# Patient Record
Sex: Female | Born: 1955 | Race: White | Hispanic: No | Marital: Single | Smoking: Current every day smoker
Health system: Southern US, Community
[De-identification: ages and names within clinical notes are randomized; demographics above are authoritative.]

## PROBLEM LIST (undated history)

## (undated) DIAGNOSIS — M1990 Unspecified osteoarthritis, unspecified site: Secondary | ICD-10-CM

## (undated) DIAGNOSIS — F4310 Post-traumatic stress disorder, unspecified: Secondary | ICD-10-CM

## (undated) DIAGNOSIS — M48061 Spinal stenosis, lumbar region without neurogenic claudication: Secondary | ICD-10-CM

## (undated) DIAGNOSIS — G4730 Sleep apnea, unspecified: Secondary | ICD-10-CM

## (undated) DIAGNOSIS — E7800 Pure hypercholesterolemia, unspecified: Secondary | ICD-10-CM

## (undated) DIAGNOSIS — F410 Panic disorder [episodic paroxysmal anxiety] without agoraphobia: Secondary | ICD-10-CM

## (undated) DIAGNOSIS — J449 Chronic obstructive pulmonary disease, unspecified: Secondary | ICD-10-CM

## (undated) HISTORY — PX: SKIN BIOPSY: SHX1

## (undated) HISTORY — PX: HERNIA REPAIR: SHX51

## (undated) HISTORY — PX: ABDOMINAL HYSTERECTOMY: SHX81

## (undated) HISTORY — PX: CHOLECYSTECTOMY: SHX55

---

## 2011-03-13 DIAGNOSIS — M1990 Unspecified osteoarthritis, unspecified site: Secondary | ICD-10-CM

## 2011-03-13 HISTORY — DX: Unspecified osteoarthritis, unspecified site: M19.90

## 2012-03-12 DIAGNOSIS — F4310 Post-traumatic stress disorder, unspecified: Secondary | ICD-10-CM

## 2012-03-12 DIAGNOSIS — E7800 Pure hypercholesterolemia, unspecified: Secondary | ICD-10-CM

## 2012-03-12 DIAGNOSIS — F410 Panic disorder [episodic paroxysmal anxiety] without agoraphobia: Secondary | ICD-10-CM

## 2012-03-12 DIAGNOSIS — G4730 Sleep apnea, unspecified: Secondary | ICD-10-CM

## 2012-03-12 DIAGNOSIS — M48061 Spinal stenosis, lumbar region without neurogenic claudication: Secondary | ICD-10-CM

## 2012-03-12 HISTORY — DX: Post-traumatic stress disorder, unspecified: F43.10

## 2012-03-12 HISTORY — DX: Panic disorder (episodic paroxysmal anxiety): F41.0

## 2012-03-12 HISTORY — DX: Pure hypercholesterolemia, unspecified: E78.00

## 2012-03-12 HISTORY — PX: OTHER SURGICAL HISTORY: SHX169

## 2012-03-12 HISTORY — DX: Spinal stenosis, lumbar region without neurogenic claudication: M48.061

## 2012-03-12 HISTORY — DX: Sleep apnea, unspecified: G47.30

## 2013-03-12 HISTORY — PX: CARPAL TUNNEL RELEASE: SHX101

## 2018-03-01 ENCOUNTER — Emergency Department (HOSPITAL_COMMUNITY): Payer: Medicare (Managed Care)

## 2018-03-01 ENCOUNTER — Other Ambulatory Visit: Payer: Self-pay

## 2018-03-01 ENCOUNTER — Encounter (HOSPITAL_COMMUNITY): Payer: Self-pay | Admitting: Emergency Medicine

## 2018-03-01 ENCOUNTER — Inpatient Hospital Stay (HOSPITAL_COMMUNITY)
Admission: EM | Admit: 2018-03-01 | Discharge: 2018-03-03 | DRG: 392 | Disposition: A | Discharge status: Home / Self Care | Payer: Medicare (Managed Care) | Attending: Internal Medicine | Admitting: Internal Medicine

## 2018-03-01 DIAGNOSIS — K219 Gastro-esophageal reflux disease without esophagitis: Secondary | ICD-10-CM | POA: Diagnosis present

## 2018-03-01 DIAGNOSIS — Z720 Tobacco use: Secondary | ICD-10-CM

## 2018-03-01 DIAGNOSIS — A09 Infectious gastroenteritis and colitis, unspecified: Principal | ICD-10-CM | POA: Diagnosis present

## 2018-03-01 DIAGNOSIS — J449 Chronic obstructive pulmonary disease, unspecified: Secondary | ICD-10-CM | POA: Diagnosis present

## 2018-03-01 DIAGNOSIS — Z888 Allergy status to other drugs, medicaments and biological substances status: Secondary | ICD-10-CM

## 2018-03-01 DIAGNOSIS — M4800 Spinal stenosis, site unspecified: Secondary | ICD-10-CM | POA: Diagnosis present

## 2018-03-01 DIAGNOSIS — Z91040 Latex allergy status: Secondary | ICD-10-CM

## 2018-03-01 DIAGNOSIS — E860 Dehydration: Secondary | ICD-10-CM | POA: Diagnosis present

## 2018-03-01 DIAGNOSIS — D649 Anemia, unspecified: Secondary | ICD-10-CM | POA: Diagnosis present

## 2018-03-01 DIAGNOSIS — F419 Anxiety disorder, unspecified: Secondary | ICD-10-CM | POA: Diagnosis present

## 2018-03-01 DIAGNOSIS — K529 Noninfective gastroenteritis and colitis, unspecified: Secondary | ICD-10-CM | POA: Diagnosis present

## 2018-03-01 DIAGNOSIS — Z79899 Other long term (current) drug therapy: Secondary | ICD-10-CM

## 2018-03-01 DIAGNOSIS — F4310 Post-traumatic stress disorder, unspecified: Secondary | ICD-10-CM | POA: Diagnosis present

## 2018-03-01 HISTORY — DX: Chronic obstructive pulmonary disease, unspecified: J44.9

## 2018-03-01 HISTORY — DX: Pure hypercholesterolemia, unspecified: E78.00

## 2018-03-01 HISTORY — DX: Spinal stenosis, lumbar region without neurogenic claudication: M48.061

## 2018-03-01 HISTORY — DX: Post-traumatic stress disorder, unspecified: F43.10

## 2018-03-01 HISTORY — DX: Unspecified osteoarthritis, unspecified site: M19.90

## 2018-03-01 HISTORY — DX: Sleep apnea, unspecified: G47.30

## 2018-03-01 HISTORY — DX: Panic disorder (episodic paroxysmal anxiety): F41.0

## 2018-03-01 LAB — I-STAT CG4 LACTIC ACID, ED: Lactic Acid, Venous: 0.8 mmol/L (ref 0.5–1.9)

## 2018-03-01 LAB — COMPREHENSIVE METABOLIC PANEL
ALT: 22 U/L (ref 0–44)
AST: 22 U/L (ref 15–41)
Albumin: 3.9 g/dL (ref 3.5–5.0)
Alkaline Phosphatase: 72 U/L (ref 38–126)
Anion gap: 8 (ref 5–15)
BUN: 16 mg/dL (ref 8–23)
CO2: 24 mmol/L (ref 22–32)
Calcium: 9.3 mg/dL (ref 8.9–10.3)
Chloride: 106 mmol/L (ref 98–111)
Creatinine, Ser: 0.67 mg/dL (ref 0.44–1.00)
GFR calc Af Amer: >60 mL/min (ref >60)
GFR calc non Af Amer: >60 mL/min (ref >60)
Glucose, Bld: 123 mg/dL — ABNORMAL HIGH (ref 70–99)
Potassium: 3.6 mmol/L (ref 3.5–5.1)
Sodium: 138 mmol/L (ref 135–145)
Total Bilirubin: 1.4 mg/dL — ABNORMAL HIGH (ref 0.3–1.2)
Total Protein: 6.5 g/dL (ref 6.5–8.1)

## 2018-03-01 LAB — CBC WITH DIFFERENTIAL/PLATELET
Abs Immature Granulocytes: 0.04 10*3/uL (ref 0.00–0.07)
Basophils Absolute: 0 10*3/uL (ref 0.0–0.1)
Basophils Relative: 0 %
Eosinophils Absolute: 0 10*3/uL (ref 0.0–0.5)
Eosinophils Relative: 0 %
HCT: 39.9 % (ref 36.0–46.0)
Hemoglobin: 12.8 g/dL (ref 12.0–15.0)
Immature Granulocytes: 0 %
Lymphocytes Relative: 20 %
Lymphs Abs: 2 10*3/uL (ref 0.7–4.0)
MCH: 30.9 pg (ref 26.0–34.0)
MCHC: 32.1 g/dL (ref 30.0–36.0)
MCV: 96.4 fL (ref 80.0–100.0)
Monocytes Absolute: 0.6 10*3/uL (ref 0.1–1.0)
Monocytes Relative: 6 %
Neutro Abs: 7.5 10*3/uL (ref 1.7–7.7)
Neutrophils Relative %: 74 %
Platelets: 235 10*3/uL (ref 150–400)
RBC: 4.14 MIL/uL (ref 3.87–5.11)
RDW: 13.3 % (ref 11.5–15.5)
WBC: 10.3 10*3/uL (ref 4.0–10.5)
nRBC: 0 % (ref 0.0–0.2)

## 2018-03-01 LAB — HEMOGLOBIN AND HEMATOCRIT, BLOOD
HCT: 36.5 % (ref 36.0–46.0)
Hemoglobin: 11.5 g/dL — ABNORMAL LOW (ref 12.0–15.0)

## 2018-03-01 LAB — PROTIME-INR
INR: 1.05
Prothrombin Time: 13.6 seconds (ref 11.4–15.2)

## 2018-03-01 LAB — LIPASE, BLOOD: Lipase: 21 U/L (ref 11–51)

## 2018-03-01 LAB — POC OCCULT BLOOD, ED: Fecal Occult Bld: POSITIVE — AB

## 2018-03-01 MED ORDER — CIPROFLOXACIN IN D5W 400 MG/200ML IV SOLN
400.0000 mg | Freq: Once | INTRAVENOUS | Status: AC
  Administered 2018-03-01: 400 mg via INTRAVENOUS
  Filled 2018-03-01: qty 200

## 2018-03-01 MED ORDER — ONDANSETRON HCL 4 MG/2ML IJ SOLN
4.0000 mg | Freq: Once | INTRAMUSCULAR | Status: AC
  Administered 2018-03-01: 4 mg via INTRAVENOUS
  Filled 2018-03-01: qty 2

## 2018-03-01 MED ORDER — HYDROMORPHONE HCL 1 MG/ML IJ SOLN
1.0000 mg | INTRAMUSCULAR | Status: DC | PRN
  Administered 2018-03-02: 1 mg via INTRAVENOUS
  Filled 2018-03-01: qty 1

## 2018-03-01 MED ORDER — UMECLIDINIUM BROMIDE 62.5 MCG/INH IN AEPB
1.0000 | INHALATION_SPRAY | Freq: Every day | RESPIRATORY_TRACT | Status: DC
  Administered 2018-03-02 – 2018-03-03 (×2): 1 via RESPIRATORY_TRACT
  Filled 2018-03-01: qty 7

## 2018-03-01 MED ORDER — PANTOPRAZOLE SODIUM 40 MG IV SOLR
40.0000 mg | INTRAVENOUS | Status: DC
  Administered 2018-03-01 – 2018-03-02 (×2): 40 mg via INTRAVENOUS
  Filled 2018-03-01 (×2): qty 40

## 2018-03-01 MED ORDER — CYCLOSPORINE 0.05 % OP EMUL
1.0000 [drp] | Freq: Two times a day (BID) | OPHTHALMIC | Status: DC
  Administered 2018-03-01 – 2018-03-03 (×3): 1 [drp] via OPHTHALMIC
  Filled 2018-03-01 (×8): qty 1

## 2018-03-01 MED ORDER — ADULT MULTIVITAMIN W/MINERALS CH
1.0000 | ORAL_TABLET | Freq: Every day | ORAL | Status: DC
  Administered 2018-03-03: 1 via ORAL
  Filled 2018-03-01 (×2): qty 1

## 2018-03-01 MED ORDER — ONDANSETRON HCL 4 MG/2ML IJ SOLN
4.0000 mg | Freq: Four times a day (QID) | INTRAMUSCULAR | Status: DC | PRN
  Administered 2018-03-02 (×3): 4 mg via INTRAVENOUS
  Filled 2018-03-01 (×4): qty 2

## 2018-03-01 MED ORDER — MORPHINE SULFATE (PF) 4 MG/ML IV SOLN
8.0000 mg | Freq: Once | INTRAVENOUS | Status: AC
  Administered 2018-03-01: 8 mg via INTRAVENOUS
  Filled 2018-03-01: qty 2

## 2018-03-01 MED ORDER — SODIUM CHLORIDE 0.9 % IV SOLN
INTRAVENOUS | Status: DC | PRN
  Administered 2018-03-01: 500 mL via INTRAVENOUS

## 2018-03-01 MED ORDER — SODIUM CHLORIDE 0.9 % IV BOLUS
1000.0000 mL | Freq: Once | INTRAVENOUS | Status: AC
  Administered 2018-03-01: 1000 mL via INTRAVENOUS

## 2018-03-01 MED ORDER — VITAMIN D 25 MCG (1000 UNIT) PO TABS
1000.0000 [IU] | ORAL_TABLET | Freq: Every day | ORAL | Status: DC
  Administered 2018-03-03: 1000 [IU] via ORAL
  Filled 2018-03-01 (×5): qty 1

## 2018-03-01 MED ORDER — ONDANSETRON HCL 4 MG PO TABS: 4.0000 mg | ORAL_TABLET | Freq: Four times a day (QID) | ORAL | Status: DC | PRN

## 2018-03-01 MED ORDER — METRONIDAZOLE IN NACL 5-0.79 MG/ML-% IV SOLN
500.0000 mg | Freq: Three times a day (TID) | INTRAVENOUS | Status: DC
  Administered 2018-03-01 – 2018-03-03 (×5): 500 mg via INTRAVENOUS
  Filled 2018-03-01 (×5): qty 100

## 2018-03-01 MED ORDER — DULOXETINE HCL 20 MG PO CPEP
40.0000 mg | ORAL_CAPSULE | Freq: Every day | ORAL | Status: DC
  Administered 2018-03-03: 40 mg via ORAL
  Filled 2018-03-01 (×3): qty 2

## 2018-03-01 MED ORDER — METRONIDAZOLE IN NACL 5-0.79 MG/ML-% IV SOLN
500.0000 mg | Freq: Once | INTRAVENOUS | Status: AC
  Administered 2018-03-01: 500 mg via INTRAVENOUS
  Filled 2018-03-01: qty 100

## 2018-03-01 MED ORDER — ALPRAZOLAM 1 MG PO TABS
1.0000 mg | ORAL_TABLET | Freq: Three times a day (TID) | ORAL | Status: DC
  Administered 2018-03-01 – 2018-03-03 (×5): 1 mg via ORAL
  Filled 2018-03-01: qty 2
  Filled 2018-03-01 (×2): qty 1
  Filled 2018-03-01: qty 2
  Filled 2018-03-01 (×2): qty 1

## 2018-03-01 MED ORDER — TIOTROPIUM BROMIDE MONOHYDRATE 18 MCG IN CAPS: 2.5000 ug | ORAL_CAPSULE | Freq: Every day | RESPIRATORY_TRACT | Status: DC

## 2018-03-01 MED ORDER — IOPAMIDOL (ISOVUE-300) INJECTION 61%
100.0000 mL | Freq: Once | INTRAVENOUS | Status: AC | PRN
  Administered 2018-03-01: 100 mL via INTRAVENOUS

## 2018-03-01 MED ORDER — HYDROCODONE-ACETAMINOPHEN 5-325 MG PO TABS
1.0000 | ORAL_TABLET | Freq: Three times a day (TID) | ORAL | Status: DC
  Administered 2018-03-01 – 2018-03-03 (×6): 1 via ORAL
  Filled 2018-03-01 (×6): qty 1

## 2018-03-01 MED ORDER — CIPROFLOXACIN IN D5W 400 MG/200ML IV SOLN
400.0000 mg | Freq: Two times a day (BID) | INTRAVENOUS | Status: DC
  Administered 2018-03-02 – 2018-03-03 (×3): 400 mg via INTRAVENOUS
  Filled 2018-03-01 (×3): qty 200

## 2018-03-01 MED ORDER — LACTATED RINGERS IV SOLN
INTRAVENOUS | Status: DC
  Administered 2018-03-01: 17:00:00 via INTRAVENOUS

## 2018-03-01 NOTE — H&P (Signed)
History and Physical    Caylan Chenard WJX:[id number] DOB: [birth year] DOA: 03/01/2018  PCP: System, Pcp Not In   Patient coming from: Visiting from [place]  Chief Complaint: Abdominal pain with rectal bleeding  HPI: Audrey Kline is a 62 y.o. female with medical history significant for COPD, PTSD/panic attacks, GERD, and chronic pain with spinal stenosis who presents to ED with complaints of worsening lower abdominal pain along with rectal bleeding and blood clots that started last night.  This occurred about 1 hour after eating an apple pecan salad at [facility].  She is visiting from [place] for the holidays and states that she is overall feeling quite weak and dehydrated.  She describes her pain as severe and constant with diffuse cramping.  She denies any nausea, vomiting, fevers, or chills.  She denies taking any blood thinners or NSAIDs.  She states that she has had a colonoscopy in the past and is overdue for one currently.  No recent antibiotic use noted.   ED Course: Vital signs are stable and laboratory data with no acute abnormalities.  CT scan of the abdomen pelvis demonstrates findings of colitis and a right lower lobe lung nodule with recommendations to reimage in 6 to 12 months.  Patient has been started on IV ciprofloxacin and Flagyl per GI recommendations and has been given some IV fluid and pain medications with minimal control of her pain.  A stool panel has been obtained.  Review of Systems: All others reviewed and otherwise negative.  Past Medical History:  Diagnosis Date  . COPD (chronic obstructive pulmonary disease) ([place])   . Hypercholesterolemia 2014  . Osteoarthrosis 2013  . Panic attacks 2014  . PTSD (post-traumatic stress disorder) 2014  . Sleep apnea 2014  . Spinal stenosis of lumbar region 2014    Past Surgical History:  Procedure Laterality Date  . ABDOMINAL HYSTERECTOMY    . CARPAL TUNNEL RELEASE Right 2015  . CHOLECYSTECTOMY    . [place]  Left 2014  . HERNIA REPAIR    . SKIN BIOPSY       reports that she has been smoking cigarettes. She has a 12.50 pack-year smoking history. She has never used smokeless tobacco. She reports previous alcohol use. She reports previous drug use.  Allergies  Allergen Reactions  . Nsaids Diarrhea  . Latex Rash    History reviewed. No pertinent family history.  Prior to Admission medications   Medication Sig Start Date End Date Taking? Authorizing Provider  albuterol (PROVENTIL HFA;VENTOLIN HFA) 108 (90 Base) MCG/ACT inhaler Inhale 2 puffs into the lungs every 4 (four) hours as needed for wheezing or shortness of breath.   Yes [provider]  ALPRAZolam Duanne Moron) 1 MG tablet Take 1 mg by mouth 3 (three) times daily.   Yes [provider]  cholecalciferol (VITAMIN D3) 25 MCG (1000 UT) tablet Take 1,000 Units by mouth daily.   Yes [provider]  cycloSPORINE (RESTASIS) 0.05 % ophthalmic emulsion Place 1 drop into both eyes 2 (two) times daily.   Yes [provider]  DULoxetine (CYMBALTA) 20 MG capsule Take 40 mg by mouth daily.   Yes [provider]  HYDROcodone-acetaminophen (NORCO/VICODIN) 5-325 MG tablet Take 1 tablet by mouth 3 (three) times daily.   Yes [provider]  Multiple Vitamin (MULTIVITAMIN) capsule Take 1 capsule by mouth daily.   Yes [provider]  omeprazole (PRILOSEC) 40 MG capsule Take 40 mg by mouth as needed.   Yes [provider]  tiotropium (  SPIRIVA) 18 MCG inhalation capsule Place 2.5 mcg into inhaler and inhale daily.   Yes [provider]    Physical Exam: Vitals:   03/01/18 1345 03/01/18 1400 03/01/18 1415 03/01/18 1430  BP:  (!) 105/57  125/69  Pulse: 72 60 64 (!) 59  Resp: 16 15 13 13   Temp:      TempSrc:      SpO2: 99% 100% 92% 100%  Weight:      Height:        Constitutional: NAD, calm, comfortable Vitals:   03/01/18 1345 03/01/18 1400 03/01/18 1415 03/01/18 1430  BP:   (!) 105/57  125/69  Pulse: 72 60 64 (!) 59  Resp: 16 15 13 13   Temp:      TempSrc:      SpO2: 99% 100% 92% 100%  Weight:      Height:       Eyes: lids and conjunctivae normal ENMT: Mucous membranes are moist.  Neck: normal, supple Respiratory: clear to auscultation bilaterally. Normal respiratory effort. No accessory muscle use.  Currently on 1 L nasal cannula Cardiovascular: Regular rate and rhythm, no murmurs. No extremity edema. Abdomen: Tenderness to gentle palpation over the lower quadrants, no distention. Bowel sounds positive.  Musculoskeletal:  No joint deformity upper and lower extremities.   Skin: no rashes, lesions, ulcers.  Psychiatric: Normal judgment and insight. Alert and oriented x 3. Normal mood.   Labs on Admission: I have personally reviewed following labs and imaging studies  CBC: Recent Labs  Lab 03/01/18 1156  WBC 10.3  NEUTROABS 7.5  HGB 12.8  HCT 39.9  MCV 96.4  PLT 202   Basic Metabolic Panel: Recent Labs  Lab 03/01/18 1156  NA 138  K 3.6  CL 106  CO2 24  GLUCOSE 123*  BUN 16  CREATININE 0.67  CALCIUM 9.3   GFR: Estimated Creatinine Clearance: 68 mL/min (by C-G formula based on SCr of 0.67 mg/dL). Liver Function Tests: Recent Labs  Lab 03/01/18 1156  AST 22  ALT 22  ALKPHOS 72  BILITOT 1.4*  PROT 6.5  ALBUMIN 3.9   Recent Labs  Lab 03/01/18 1156  LIPASE 21   No results for input(s): AMMONIA in the last 168 hours. Coagulation Profile: Recent Labs  Lab 03/01/18 1156  INR 1.05   Cardiac Enzymes: No results for input(s): CKTOTAL, CKMB, CKMBINDEX, TROPONINI in the last 168 hours. BNP (last 3 results) No results for input(s): PROBNP in the last 8760 hours. HbA1C: No results for input(s): HGBA1C in the last 72 hours. CBG: No results for input(s): GLUCAP in the last 168 hours. Lipid Profile: No results for input(s): CHOL, HDL, LDLCALC, TRIG, CHOLHDL, LDLDIRECT in the last 72 hours. Thyroid Function Tests: No results  for input(s): TSH, T4TOTAL, FREET4, T3FREE, THYROIDAB in the last 72 hours. Anemia Panel: No results for input(s): VITAMINB12, FOLATE, FERRITIN, TIBC, IRON, RETICCTPCT in the last 72 hours. Urine analysis: No results found for: COLORURINE, APPEARANCEUR, LABSPEC, [place], GLUCOSEU, HGBUR, BILIRUBINUR, KETONESUR, PROTEINUR, UROBILINOGEN, NITRITE, LEUKOCYTESUR  Radiological Exams on Admission: Ct Abdomen Pelvis W Contrast  Result Date: 03/01/2018 CLINICAL DATA:  GI bleed, abdominal pain, suspected diverticulitis, 4 episodes rectal bleeding since yesterday afternoon, bright red blood with each bowel movement, nausea, decreased appetite, history COPD EXAM: CT ABDOMEN AND PELVIS WITH CONTRAST TECHNIQUE: Multidetector CT imaging of the abdomen and pelvis was performed using the standard protocol following bolus administration of intravenous contrast. Sagittal and coronal MPR images reconstructed from axial data set. CONTRAST:  12mL ISOVUE-300 IOPAMIDOL (ISOVUE-300) INJECTION 61% IV. No oral contrast. COMPARISON:  None FINDINGS: Lower chest: Subsegmental atelectasis at both lung bases dependently. Noncalcified 6 mm RIGHT lower lobe pulmonary nodule image 16. Lung bases otherwise clear. Hepatobiliary: Gallbladder surgically absent. 12 mm CBD without significant intrahepatic biliary dilatation. Small focus of geographic fatty infiltration of liver medially with an area of mildly increased attenuation at the medial segment LEFT lobe liver adjacent to the falciform fissure likely focal sparing. No additional focal hepatic masses Pancreas: Normal appearance Spleen: Normal appearance Adrenals/Urinary Tract: Adrenal glands normal appearance. Kidneys, ureters, and bladder normal appearance Stomach/Bowel: Normal appendix adjacent to inferior RIGHT lobe liver. Stomach and small bowel loops normal appearance. Bowel wall thickening of descending and sigmoid colon with minimal pericolic stranding compatible with colitis. No  colonic diverticula seen. Mild hyperemia of sigmoid mesocolon. No evidence of perforation or abscess. Proximal colon unremarkable. Vascular/Lymphatic: Atherosclerotic calcifications aorta without aneurysm. No adenopathy. Reproductive: Uterus surgically absent. Nonvisualization of ovaries. Other: No free air or free fluid.  No hernia. Musculoskeletal: No acute osseous findings. IMPRESSION: Long segment of colonic wall thickening involving the descending and sigmoid colon with minimal associated pericolic stranding consistent with colitis; differential diagnosis would include infection and inflammatory bowel disease, with ischemia considered less likely due to distribution and absence of associated severe vascular disease changes. Noncalcified 6 mm RIGHT lower lobe pulmonary nodule, recommendation below. Non-contrast chest CT at 6-12 months is recommended. If the nodule is stable at time of repeat CT, then future CT at 18-24 months (from today's scan) is considered optional for low-risk patients, but is recommended for high-risk patients. This recommendation follows the consensus statement: Guidelines for Management of Incidental Pulmonary Nodules Detected on CT Images: From the Fleischner Society 2017; Radiology 2017; 284:228-243. Electronically Signed   By: Lavonia Dana M.D.   On: 03/01/2018 14:17    Assessment/Plan Principal Problem:   Colitis Active Problems:   COPD (chronic obstructive pulmonary disease) (H[facility])   PTSD (post-traumatic stress disorder)   Spinal stenosis   GERD (gastroesophageal reflux disease)    1. Colitis-likely infectious.  This is associated with some rectal bleeding which will be carefully monitored with repeat H&H overnight.  Maintain on IV ciprofloxacin and Flagyl as well as IV fluids.  Clear liquid diet with gentle advancement as tolerated.  Maintain on IV pain medications for pain control. 2. COPD.  Maintain on Spiriva with albuterol inhaler as needed for wheezing or shortness  of breath. 3. Tobacco abuse.  Counseled on cessation.  Will use nicotine patch. 4. PTSD/anxiety.  Maintain on Xanax and Cymbalta. 5. Chronic pain with spinal stenosis.  Maintain on IV pain medications as needed as well as oral narcotics at baseline. 6. GERD.  PPI IV.   DVT prophylaxis: SCDs Code Status: Full Family Communication: Granddaughter at bedside Disposition Plan: Admit for IV antibiotics and treatment of colitis Consults called: None Admission status: Inpatient, MedSurg   Pratik Darleen Crocker DO [facility] Pager [phone]  If 7PM-7AM, please contact night-coverage [email or web address] Password [facility]  03/01/2018, 3:04 PM

## 2018-03-01 NOTE — ED Provider Notes (Signed)
[facility] EMERGENCY DEPARTMENT Provider Note   CSN: [id number] Arrival date & time: 03/01/18  1052     History   Chief Complaint Chief Complaint  Patient presents with  . Rectal Bleeding    HPI Audrey Kline is a 62 y.o. female.  HPI  62 year old female presents with abdominal pain and rectal bleeding.  She states that she arrived here from [place] yesterday to visit family.  The patient had [employer] and had an apple pecan salad that had some chicken in it.  About 1 hour later started developing severe lower abdominal pain.  This progressed into diarrhea starting last night/middle the night.  One episode of diarrhea but then ever since then she has been having blood.  She states the first it was loose and then now the last 2 times has had clots.  She feels overall weak and dehydrated.  She is having severe, constant sharp and cramping abdominal pain that is diffuse.  No vomiting but she is nauseated.  She is not on any blood thinners and does not take NSAIDs.  She states she has had a colonoscopy but she is not sure when and does not think she is due for 1 currently.  She states that her colonoscopy was reportedly normal. No travel outside of the country and no recent antibiotic use.  Past Medical History:  Diagnosis Date  . COPD (chronic obstructive pulmonary disease) ([place])   . Hypercholesterolemia 2014  . Osteoarthrosis 2013  . Panic attacks 2014  . PTSD (post-traumatic stress disorder) 2014  . Sleep apnea 2014  . Spinal stenosis of lumbar region 2014    Patient Active Problem List   Diagnosis Date Noted  . Colitis 03/01/2018  . COPD (chronic obstructive pulmonary disease) ([place]) 03/01/2018  . PTSD (post-traumatic stress disorder) 03/01/2018  . Spinal stenosis 03/01/2018  . GERD (gastroesophageal reflux disease) 03/01/2018    Past Surgical History:  Procedure Laterality Date  . ABDOMINAL HYSTERECTOMY    . CARPAL TUNNEL RELEASE Right 2015  . CHOLECYSTECTOMY      . [place] Left 2014  . HERNIA REPAIR    . SKIN BIOPSY       OB History   No obstetric history on file.      Home Medications    Prior to Admission medications   Medication Sig Start Date End Date Taking? Authorizing Provider  albuterol (PROVENTIL HFA;VENTOLIN HFA) 108 (90 Base) MCG/ACT inhaler Inhale 2 puffs into the lungs every 4 (four) hours as needed for wheezing or shortness of breath.   Yes [provider]  ALPRAZolam Duanne Moron) 1 MG tablet Take 1 mg by mouth 3 (three) times daily.   Yes [provider]  cholecalciferol (VITAMIN D3) 25 MCG (1000 UT) tablet Take 1,000 Units by mouth daily.   Yes [provider]  cycloSPORINE (RESTASIS) 0.05 % ophthalmic emulsion Place 1 drop into both eyes 2 (two) times daily.   Yes [provider]  DULoxetine (CYMBALTA) 20 MG capsule Take 40 mg by mouth daily.   Yes [provider]  HYDROcodone-acetaminophen (NORCO/VICODIN) 5-325 MG tablet Take 1 tablet by mouth 3 (three) times daily.   Yes [provider]  Multiple Vitamin (MULTIVITAMIN) capsule Take 1 capsule by mouth daily.   Yes [provider]  omeprazole (PRILOSEC) 40 MG capsule Take 40 mg by mouth as needed.   Yes [provider]  tiotropium (SPIRIVA) 18 MCG inhalation capsule Place 2.5 mcg into inhaler and inhale daily.   Yes [provider]    Family History History reviewed. No pertinent family history.  Social History Social History   Tobacco Use  . Smoking status: Current Every Day Smoker    Packs/day: 0.50    Years: 25.00    Pack years: 12.50    Types: Cigarettes  . Smokeless tobacco: Never Used  Substance Use Topics  . Alcohol use: Not Currently  . Drug use: Not Currently     Allergies   Nsaids and Latex   Review of Systems Review of Systems  Gastrointestinal: Positive for abdominal pain, blood in stool, diarrhea and nausea. Negative for vomiting.  Neurological: Positive for  weakness.  All other systems reviewed and are negative.    Physical Exam Updated Vital Signs BP 125/69   Pulse (!) 59   Temp 98 F (36.7 C) (Oral)   Resp 13   Ht 5\' 2"  (1.575 m)   Wt 72.6 kg   SpO2 100%   BMI 29.26 kg/m   Physical Exam Vitals signs and nursing note reviewed.  Constitutional:      Appearance: She is well-developed. She is not diaphoretic.  HENT:     Head: Normocephalic and atraumatic.     Right Ear: External ear normal.     Left Ear: External ear normal.     Nose: Nose normal.  Eyes:     General:        Right eye: No discharge.        Left eye: No discharge.  Cardiovascular:     Rate and Rhythm: Normal rate and regular rhythm.     Heart sounds: Normal heart sounds.  Pulmonary:     Effort: Pulmonary effort is normal.     Breath sounds: Normal breath sounds.  Abdominal:     Palpations: Abdomen is soft.     Tenderness: There is generalized abdominal tenderness.  Genitourinary:    Comments: Small amount of blood on DRE. No masses, hemorrhoids or fissures Skin:    General: Skin is warm and dry.  Neurological:     Mental Status: She is alert.  Psychiatric:        Mood and Affect: Mood is not anxious.      ED Treatments / Results  Labs (all labs ordered are listed, but only abnormal results are displayed) Labs Reviewed  COMPREHENSIVE METABOLIC PANEL - Abnormal; Notable for the following components:      Result Value   Glucose, Bld 123 (*)    Total Bilirubin 1.4 (*)    All other components within normal limits  POC OCCULT BLOOD, ED - Abnormal; Notable for the following components:   Fecal Occult Bld POSITIVE (*)    All other components within normal limits  GASTROINTESTINAL PANEL BY PCR, STOOL (REPLACES STOOL CULTURE)  LIPASE, BLOOD  CBC WITH DIFFERENTIAL/PLATELET  PROTIME-INR  I-STAT CG4 LACTIC ACID, ED  I-STAT CG4 LACTIC ACID, ED    EKG None  Radiology Ct Abdomen Pelvis W Contrast  Result Date: 03/01/2018 CLINICAL DATA:  GI  bleed, abdominal pain, suspected diverticulitis, 4 episodes rectal bleeding since yesterday afternoon, bright red blood with each bowel movement, nausea, decreased appetite, history COPD EXAM: CT ABDOMEN AND PELVIS WITH CONTRAST TECHNIQUE: Multidetector CT imaging of the abdomen and pelvis was performed using the standard protocol following bolus administration of intravenous contrast. Sagittal and coronal MPR images reconstructed from axial data set. CONTRAST:  125mL ISOVUE-300 IOPAMIDOL (ISOVUE-300) INJECTION 61% IV. No oral contrast. COMPARISON:  None FINDINGS: Lower chest: Subsegmental atelectasis at both  lung bases dependently. Noncalcified 6 mm RIGHT lower lobe pulmonary nodule image 16. Lung bases otherwise clear. Hepatobiliary: Gallbladder surgically absent. 12 mm CBD without significant intrahepatic biliary dilatation. Small focus of geographic fatty infiltration of liver medially with an area of mildly increased attenuation at the medial segment LEFT lobe liver adjacent to the falciform fissure likely focal sparing. No additional focal hepatic masses Pancreas: Normal appearance Spleen: Normal appearance Adrenals/Urinary Tract: Adrenal glands normal appearance. Kidneys, ureters, and bladder normal appearance Stomach/Bowel: Normal appendix adjacent to inferior RIGHT lobe liver. Stomach and small bowel loops normal appearance. Bowel wall thickening of descending and sigmoid colon with minimal pericolic stranding compatible with colitis. No colonic diverticula seen. Mild hyperemia of sigmoid mesocolon. No evidence of perforation or abscess. Proximal colon unremarkable. Vascular/Lymphatic: Atherosclerotic calcifications aorta without aneurysm. No adenopathy. Reproductive: Uterus surgically absent. Nonvisualization of ovaries. Other: No free air or free fluid.  No hernia. Musculoskeletal: No acute osseous findings. IMPRESSION: Long segment of colonic wall thickening involving the descending and sigmoid colon  with minimal associated pericolic stranding consistent with colitis; differential diagnosis would include infection and inflammatory bowel disease, with ischemia considered less likely due to distribution and absence of associated severe vascular disease changes. Noncalcified 6 mm RIGHT lower lobe pulmonary nodule, recommendation below. Non-contrast chest CT at 6-12 months is recommended. If the nodule is stable at time of repeat CT, then future CT at 18-24 months (from today's scan) is considered optional for low-risk patients, but is recommended for high-risk patients. This recommendation follows the consensus statement: Guidelines for Management of Incidental Pulmonary Nodules Detected on CT Images: From the Fleischner Society 2017; Radiology 2017; 284:228-243. Electronically Signed   By: Lavonia Dana M.D.   On: 03/01/2018 14:17    Procedures Procedures (including critical care time)  Medications Ordered in ED Medications  morphine 4 MG/ML injection 8 mg (has no administration in time range)  ciprofloxacin (CIPRO) IVPB 400 mg (has no administration in time range)  metroNIDAZOLE (FLAGYL) IVPB 500 mg (has no administration in time range)  sodium chloride 0.9 % bolus 1,000 mL (has no administration in time range)  ondansetron (ZOFRAN) injection 4 mg (4 mg Intravenous Given 03/01/18 1222)  sodium chloride 0.9 % bolus 1,000 mL (0 mLs Intravenous Stopped 03/01/18 1438)  morphine 4 MG/ML injection 8 mg (8 mg Intravenous Given 03/01/18 1222)  iopamidol (ISOVUE-300) 61 % injection 100 mL (100 mLs Intravenous Contrast Given 03/01/18 1337)     Initial Impression / Assessment and Plan / ED Course  I have reviewed the triage vital signs and the nursing notes.  Pertinent labs & imaging results that were available during my care of the patient were reviewed by me and considered in my medical decision making (see chart for details).     CT scan confirms colitis.  Her pain is not terribly well controlled  after dose of IV morphine.  While she is better she is still requesting more medicine.  She will be given a second dose of IV morphine.  I discussed with GI, Dr. Laural Golden, who recommends admission and treatment for likely ischemic colitis.  He states IV antibiotics would be okay given her degree of pain.  She will be given IV fluids as well.  She will be admitted to the hospitalist service for further supportive care and observation.  Final Clinical Impressions(s) / ED Diagnoses   Final diagnoses:  Colitis    ED Discharge Orders    None       Sherwood Gambler,  MD 03/01/18 1441

## 2018-03-01 NOTE — ED Triage Notes (Signed)
Pt reports rectal bleeding x 4 episodes since yesterday afternoon, pt reports bright red blood with each BM, pt c/o nausea and decreased appetite

## 2018-03-02 LAB — COMPREHENSIVE METABOLIC PANEL
ALT: 19 U/L (ref 0–44)
AST: 19 U/L (ref 15–41)
Albumin: 3.4 g/dL — ABNORMAL LOW (ref 3.5–5.0)
Alkaline Phosphatase: 58 U/L (ref 38–126)
Anion gap: 4 — ABNORMAL LOW (ref 5–15)
BUN: 10 mg/dL (ref 8–23)
CO2: 27 mmol/L (ref 22–32)
Calcium: 8.5 mg/dL — ABNORMAL LOW (ref 8.9–10.3)
Chloride: 107 mmol/L (ref 98–111)
Creatinine, Ser: 0.64 mg/dL (ref 0.44–1.00)
GFR calc Af Amer: >60 mL/min (ref >60)
GFR calc non Af Amer: >60 mL/min (ref >60)
Glucose, Bld: 108 mg/dL — ABNORMAL HIGH (ref 70–99)
Potassium: 4.1 mmol/L (ref 3.5–5.1)
Sodium: 138 mmol/L (ref 135–145)
Total Bilirubin: 0.7 mg/dL (ref 0.3–1.2)
Total Protein: 5.6 g/dL — ABNORMAL LOW (ref 6.5–8.1)

## 2018-03-02 LAB — HEMOGLOBIN AND HEMATOCRIT, BLOOD
HCT: 34.1 % — ABNORMAL LOW (ref 36.0–46.0)
Hemoglobin: 10.7 g/dL — ABNORMAL LOW (ref 12.0–15.0)

## 2018-03-02 LAB — CBC
HCT: 37.6 % (ref 36.0–46.0)
Hemoglobin: 11.7 g/dL — ABNORMAL LOW (ref 12.0–15.0)
MCH: 31.1 pg (ref 26.0–34.0)
MCHC: 31.1 g/dL (ref 30.0–36.0)
MCV: 100 fL (ref 80.0–100.0)
Platelets: 192 10*3/uL (ref 150–400)
RBC: 3.76 MIL/uL — ABNORMAL LOW (ref 3.87–5.11)
RDW: 13.5 % (ref 11.5–15.5)
WBC: 10.1 10*3/uL (ref 4.0–10.5)
nRBC: 0 % (ref 0.0–0.2)

## 2018-03-02 NOTE — Progress Notes (Signed)
PROGRESS NOTE    Audrey Kline  SPQ:[id number] DOB: 1955/07/03 DOA: 03/01/2018 PCP: System, Pcp Not In   Brief Narrative:  Per HPI: Audrey Kline is a 62 y.o. female with medical history significant for COPD, PTSD/panic attacks, GERD, and chronic pain with spinal stenosis who presents to ED with complaints of worsening lower abdominal pain along with rectal bleeding and blood clots that started last night.  This occurred about 1 hour after eating an apple pecan salad at [facility].  She is visiting from [place] for the holidays and states that she is overall feeling quite weak and dehydrated.  She describes her pain as severe and constant with diffuse cramping.  She denies any nausea, vomiting, fevers, or chills.  She denies taking any blood thinners or NSAIDs.  She states that she has had a colonoscopy in the past and is overdue for one currently.  No recent antibiotic use noted.  Assessment & Plan:   Principal Problem:   Colitis Active Problems:   COPD (chronic obstructive pulmonary disease) (HCC)   PTSD (post-traumatic stress disorder)   Spinal stenosis   GERD (gastroesophageal reflux disease)  1. Colitis-likely infectious;ongoing.  This is associated with some rectal bleeding which will be carefully monitored with repeat CBC in am.  Maintain on IV ciprofloxacin and Flagyl as well as IV fluids.  Clear liquid diet with gentle advancement as tolerated.  Maintain on IV pain medications for pain control. 2. Rectal bleed secondary to above. Anemia remains stable with no need for GI intervention at this point. 3. COPD.  Maintain on Spiriva with albuterol inhaler as needed for wheezing or shortness of breath. 4. Tobacco abuse.  Counseled on cessation.  Will use nicotine patch. 5. PTSD/anxiety.  Maintain on Xanax and Cymbalta. 6. Chronic pain with spinal stenosis.  Maintain on IV pain medications as needed as well as oral narcotics at baseline. 7. GERD.  PPI IV.  DVT  prophylaxis:SCDs Code Status: Full Family Communication: Granddaughter at bedside Disposition Plan: Continue IV antibiotics and monitor response and rectal bleeding. Maintain on clears.   Consultants:   None  Procedures:   None  Antimicrobials:   IV ciprofloxacin and Flagyl 12/21->   Subjective: Patient seen and evaluated today with no new acute complaints or concerns. No acute concerns or events noted overnight.  She continues to have some mild abdominal pain which is improved and complains of some headaches this morning.  She is also somewhat nauseous and unable to tolerate her diet.  Objective: Vitals:   03/02/18 0600 03/02/18 0747 03/02/18 0838 03/02/18 0900  BP: (!) 110/59 (!) 120/59 109/70 111/63  Pulse:  71 75 69  Resp: 17 17 18 18   Temp:  98.4 F (36.9 C)  98.7 F (37.1 C)  TempSrc:  Oral  Oral  SpO2:  94% 98% 90%  Weight:    73 kg  Height:    5\' 2"  (1.575 m)    Intake/Output Summary (Last 24 hours) at 03/02/2018 1037 Last data filed at 03/02/2018 0831 Gross per 24 hour  Intake 3095 ml  Output -  Net 3095 ml   Filed Weights   03/01/18 1100 03/02/18 0900  Weight: 72.6 kg 73 kg    Examination:  General exam: Appears calm and comfortable  Respiratory system: Clear to auscultation. Respiratory effort normal. Cardiovascular system: S1 & S2 heard, RRR. No JVD, murmurs, rubs, gallops or clicks. No pedal edema. Gastrointestinal system: Abdomen is nondistended, soft and minimally tender. No organomegaly or masses felt. Normal bowel  sounds heard. Central nervous system: Alert and oriented. No focal neurological deficits. Extremities: Symmetric 5 x 5 power. Skin: No rashes, lesions or ulcers Psychiatry: Judgement and insight appear normal. Mood & affect appropriate.     Data Reviewed: I have personally reviewed following labs and imaging studies  CBC: Recent Labs  Lab 03/01/18 1156 03/01/18 1751 03/02/18 0013 03/02/18 0719  WBC 10.3  --   --  10.1   NEUTROABS 7.5  --   --   --   HGB 12.8 11.5* 10.7* 11.7*  HCT 39.9 36.5 34.1* 37.6  MCV 96.4  --   --  100.0  PLT 235  --   --  643   Basic Metabolic Panel: Recent Labs  Lab 03/01/18 1156 03/02/18 0719  NA 138 138  K 3.6 4.1  CL 106 107  CO2 24 27  GLUCOSE 123* 108*  BUN 16 10  CREATININE 0.67 0.64  CALCIUM 9.3 8.5*   GFR: Estimated Creatinine Clearance: 68.3 mL/min (by C-G formula based on SCr of 0.64 mg/dL). Liver Function Tests: Recent Labs  Lab 03/01/18 1156 03/02/18 0719  AST 22 19  ALT 22 19  ALKPHOS 72 58  BILITOT 1.4* 0.7  PROT 6.5 5.6*  ALBUMIN 3.9 3.4*   Recent Labs  Lab 03/01/18 1156  LIPASE 21   No results for input(s): AMMONIA in the last 168 hours. Coagulation Profile: Recent Labs  Lab 03/01/18 1156  INR 1.05   Cardiac Enzymes: No results for input(s): CKTOTAL, CKMB, CKMBINDEX, TROPONINI in the last 168 hours. BNP (last 3 results) No results for input(s): PROBNP in the last 8760 hours. HbA1C: No results for input(s): HGBA1C in the last 72 hours. CBG: No results for input(s): GLUCAP in the last 168 hours. Lipid Profile: No results for input(s): CHOL, HDL, LDLCALC, TRIG, CHOLHDL, LDLDIRECT in the last 72 hours. Thyroid Function Tests: No results for input(s): TSH, T4TOTAL, FREET4, T3FREE, THYROIDAB in the last 72 hours. Anemia Panel: No results for input(s): VITAMINB12, FOLATE, FERRITIN, TIBC, IRON, RETICCTPCT in the last 72 hours. Sepsis Labs: Recent Labs  Lab 03/01/18 1231  LATICACIDVEN 0.80    No results found for this or any previous visit (from the past 240 hour(s)).       Radiology Studies: Ct Abdomen Pelvis W Contrast  Result Date: 03/01/2018 CLINICAL DATA:  GI bleed, abdominal pain, suspected diverticulitis, 4 episodes rectal bleeding since yesterday afternoon, bright red blood with each bowel movement, nausea, decreased appetite, history COPD EXAM: CT ABDOMEN AND PELVIS WITH CONTRAST TECHNIQUE: Multidetector CT  imaging of the abdomen and pelvis was performed using the standard protocol following bolus administration of intravenous contrast. Sagittal and coronal MPR images reconstructed from axial data set. CONTRAST:  121mL ISOVUE-300 IOPAMIDOL (ISOVUE-300) INJECTION 61% IV. No oral contrast. COMPARISON:  None FINDINGS: Lower chest: Subsegmental atelectasis at both lung bases dependently. Noncalcified 6 mm RIGHT lower lobe pulmonary nodule image 16. Lung bases otherwise clear. Hepatobiliary: Gallbladder surgically absent. 12 mm CBD without significant intrahepatic biliary dilatation. Small focus of geographic fatty infiltration of liver medially with an area of mildly increased attenuation at the medial segment LEFT lobe liver adjacent to the falciform fissure likely focal sparing. No additional focal hepatic masses Pancreas: Normal appearance Spleen: Normal appearance Adrenals/Urinary Tract: Adrenal glands normal appearance. Kidneys, ureters, and bladder normal appearance Stomach/Bowel: Normal appendix adjacent to inferior RIGHT lobe liver. Stomach and small bowel loops normal appearance. Bowel wall thickening of descending and sigmoid colon with minimal pericolic stranding compatible with  colitis. No colonic diverticula seen. Mild hyperemia of sigmoid mesocolon. No evidence of perforation or abscess. Proximal colon unremarkable. Vascular/Lymphatic: Atherosclerotic calcifications aorta without aneurysm. No adenopathy. Reproductive: Uterus surgically absent. Nonvisualization of ovaries. Other: No free air or free fluid.  No hernia. Musculoskeletal: No acute osseous findings. IMPRESSION: Long segment of colonic wall thickening involving the descending and sigmoid colon with minimal associated pericolic stranding consistent with colitis; differential diagnosis would include infection and inflammatory bowel disease, with ischemia considered less likely due to distribution and absence of associated severe vascular disease  changes. Noncalcified 6 mm RIGHT lower lobe pulmonary nodule, recommendation below. Non-contrast chest CT at 6-12 months is recommended. If the nodule is stable at time of repeat CT, then future CT at 18-24 months (from today's scan) is considered optional for low-risk patients, but is recommended for high-risk patients. This recommendation follows the consensus statement: Guidelines for Management of Incidental Pulmonary Nodules Detected on CT Images: From the Fleischner Society 2017; Radiology 2017; 284:228-243. Electronically Signed   By: Lavonia Dana M.D.   On: 03/01/2018 14:17        Scheduled Meds: . ALPRAZolam  1 mg Oral TID  . cholecalciferol  1,000 Units Oral Daily  . cycloSPORINE  1 drop Both Eyes BID  . DULoxetine  40 mg Oral Daily  . HYDROcodone-acetaminophen  1 tablet Oral Q8H  . multivitamin with minerals  1 tablet Oral Daily  . pantoprazole (PROTONIX) IV  40 mg Intravenous Q24H  . umeclidinium bromide  1 puff Inhalation Daily   Continuous Infusions: . sodium chloride 500 mL (03/01/18 1521)  . ciprofloxacin Stopped (03/02/18 0428)  . lactated ringers 75 mL/hr at 03/01/18 2212  . metronidazole Stopped (03/02/18 0831)     LOS: 1 day    Time spent: 30 minutes    Rocio Roam Darleen Crocker, DO [facility] Pager [phone]  If 7PM-7AM, please contact night-coverage [email or web address] Password [facility] 03/02/2018, 10:37 AM

## 2018-03-02 NOTE — Progress Notes (Signed)
Since admiision this morning has not had a bm. Continues to c/o abd pain and nausea but has not vomited and has had good intake from clear liquid tray and ate an ice cream by mistake. Family has been at bedside all day.

## 2018-03-03 LAB — HIV ANTIBODY (ROUTINE TESTING W REFLEX): HIV Screen 4th Generation wRfx: NONREACTIVE

## 2018-03-03 LAB — CBC
HCT: 34.9 % — ABNORMAL LOW (ref 36.0–46.0)
Hemoglobin: 11 g/dL — ABNORMAL LOW (ref 12.0–15.0)
MCH: 30.6 pg (ref 26.0–34.0)
MCHC: 31.5 g/dL (ref 30.0–36.0)
MCV: 97.2 fL (ref 80.0–100.0)
Platelets: 192 10*3/uL (ref 150–400)
RBC: 3.59 MIL/uL — ABNORMAL LOW (ref 3.87–5.11)
RDW: 13 % (ref 11.5–15.5)
WBC: 6.9 10*3/uL (ref 4.0–10.5)
nRBC: 0 % (ref 0.0–0.2)

## 2018-03-03 LAB — BASIC METABOLIC PANEL
Anion gap: 6 (ref 5–15)
BUN: 5 mg/dL — ABNORMAL LOW (ref 8–23)
CO2: 28 mmol/L (ref 22–32)
Calcium: 9 mg/dL (ref 8.9–10.3)
Chloride: 109 mmol/L (ref 98–111)
Creatinine, Ser: 0.61 mg/dL (ref 0.44–1.00)
GFR calc Af Amer: >60 mL/min (ref >60)
GFR calc non Af Amer: >60 mL/min (ref >60)
Glucose, Bld: 131 mg/dL — ABNORMAL HIGH (ref 70–99)
Potassium: 3.3 mmol/L — ABNORMAL LOW (ref 3.5–5.1)
Sodium: 143 mmol/L (ref 135–145)

## 2018-03-03 MED ORDER — ONDANSETRON HCL 4 MG PO TABS: 4.0000 mg | ORAL_TABLET | Freq: Four times a day (QID) | ORAL | 0 refills | Status: DC | PRN

## 2018-03-03 MED ORDER — DICYCLOMINE HCL 10 MG PO CAPS: 10.0000 mg | ORAL_CAPSULE | Freq: Three times a day (TID) | ORAL | 0 refills | Status: DC

## 2018-03-03 MED ORDER — POTASSIUM CHLORIDE CRYS ER 20 MEQ PO TBCR
40.0000 meq | EXTENDED_RELEASE_TABLET | Freq: Once | ORAL | Status: AC
  Administered 2018-03-03: 40 meq via ORAL
  Filled 2018-03-03: qty 2

## 2018-03-03 MED ORDER — HYDROCODONE-ACETAMINOPHEN 5-325 MG PO TABS: 1.0000 | ORAL_TABLET | Freq: Four times a day (QID) | ORAL | Status: DC | PRN

## 2018-03-03 MED ORDER — DICYCLOMINE HCL 10 MG PO CAPS
10.0000 mg | ORAL_CAPSULE | Freq: Three times a day (TID) | ORAL | Status: DC
  Administered 2018-03-03: 10 mg via ORAL
  Filled 2018-03-03: qty 1

## 2018-03-03 MED ORDER — METRONIDAZOLE 500 MG PO TABS: 500.0000 mg | ORAL_TABLET | Freq: Three times a day (TID) | ORAL | 0 refills | Status: AC

## 2018-03-03 MED ORDER — CIPROFLOXACIN HCL 500 MG PO TABS: 500.0000 mg | ORAL_TABLET | Freq: Two times a day (BID) | ORAL | 0 refills | Status: AC

## 2018-03-03 NOTE — Discharge Summary (Signed)
Physician Discharge Summary  [name] TJQ:[id number] DOB: 05/01/1955 DOA: 03/01/2018  PCP: System, Pcp Not In  Admit date: 03/01/2018  Discharge date: 03/03/2018  Admitted From:Home  Disposition:  Home  Recommendations for Outpatient Follow-up:  1. Follow up with PCP in 1-2 weeks 2. Continue on oral ciprofloxacin and Flagyl for 8 more days to complete a total 10-day course of treatment 3. Continue on Zofran and Bentyl as needed for symptomatic relief  Home Health: None  Equipment/Devices: None  Discharge Condition: Stable  CODE STATUS: Full  Diet recommendation: Heart Healthy  Brief/Interim Summary: Per HPI: Audrey Kline a 62 y.o.femalewith medical history significant forCOPD, PTSD/panic attacks, GERD, and chronic pain with spinal stenosis who presents to ED with complaints of worsening lower abdominal pain along with rectal bleeding and blood clots that started last night. This occurred about 1 hour after eating an apple pecan salad at [facility]. She is visiting from [place] for the holidays and states that she is overall feeling quite weak and dehydrated. She describes her pain as severe and constant with diffuse cramping. She denies any nausea, vomiting, fevers, or chills. She denies taking any blood thinners or NSAIDs. She states that she has had a colonoscopy in the past and is overdue for one currently. No recent antibiotic use noted.  Patient was admitted for colitis that appears to be infectious in etiology.  She was treated with IV ciprofloxacin and Flagyl for 2 days with significant improvement in her symptoms.  She had no further rectal bleed and hemoglobin and hematocrit have remained stable.  She is now tolerating a diet and can be switched to oral ciprofloxacin and Flagyl to continue another 8-day course of treatment.  She will be discharged with some Zofran as needed as well as Bentyl for cramping pain.  No other acute events noted during the  brief course of admission.  Discharge Diagnoses:  Principal Problem:   Colitis Active Problems:   COPD (chronic obstructive pulmonary disease) (HCC)   PTSD (post-traumatic stress disorder)   Spinal stenosis   GERD (gastroesophageal reflux disease)  Principal discharge diagnosis: Colitis secondary to infectious etiology with associated rectal bleeding.  Discharge Instructions  Discharge Instructions    Diet - low sodium heart healthy   Complete by:  As directed    Increase activity slowly   Complete by:  As directed      Allergies as of 03/03/2018      Reactions   Nsaids Diarrhea   Latex Rash      Medication List    TAKE these medications   albuterol 108 (90 Base) MCG/ACT inhaler Commonly known as:  PROVENTIL HFA;VENTOLIN HFA Inhale 2 puffs into the lungs every 4 (four) hours as needed for wheezing or shortness of breath.   ALPRAZolam 1 MG tablet Commonly known as:  XANAX Take 1 mg by mouth 3 (three) times daily.   cholecalciferol 25 MCG (1000 UT) tablet Commonly known as:  VITAMIN D3 Take 1,000 Units by mouth daily.   ciprofloxacin 500 MG tablet Commonly known as:  CIPRO Take 1 tablet (500 mg total) by mouth 2 (two) times daily for 8 days.   cycloSPORINE 0.05 % ophthalmic emulsion Commonly known as:  RESTASIS Place 1 drop into both eyes 2 (two) times daily.   dicyclomine 10 MG capsule Commonly known as:  BENTYL Take 1 capsule (10 mg total) by mouth 3 (three) times daily before meals for 7 days.   DULoxetine 20 MG capsule Commonly known as:  CYMBALTA Take  40 mg by mouth daily.   HYDROcodone-acetaminophen 5-325 MG tablet Commonly known as:  NORCO/VICODIN Take 1 tablet by mouth 3 (three) times daily.   metroNIDAZOLE 500 MG tablet Commonly known as:  FLAGYL Take 1 tablet (500 mg total) by mouth 3 (three) times daily for 8 days.   multivitamin capsule Take 1 capsule by mouth daily.   omeprazole 40 MG capsule Commonly known as:  PRILOSEC Take 40 mg by  mouth as needed.   ondansetron 4 MG tablet Commonly known as:  ZOFRAN Take 1 tablet (4 mg total) by mouth every 6 (six) hours as needed for nausea.   tiotropium 18 MCG inhalation capsule Commonly known as:  SPIRIVA Place 2.5 mcg into inhaler and inhale daily.       Allergies  Allergen Reactions  . Nsaids Diarrhea  . Latex Rash    Consultations:  None   Procedures/Studies: Ct Abdomen Pelvis W Contrast  Result Date: 03/01/2018 CLINICAL DATA:  GI bleed, abdominal pain, suspected diverticulitis, 4 episodes rectal bleeding since yesterday afternoon, bright red blood with each bowel movement, nausea, decreased appetite, history COPD EXAM: CT ABDOMEN AND PELVIS WITH CONTRAST TECHNIQUE: Multidetector CT imaging of the abdomen and pelvis was performed using the standard protocol following bolus administration of intravenous contrast. Sagittal and coronal MPR images reconstructed from axial data set. CONTRAST:  136mL ISOVUE-300 IOPAMIDOL (ISOVUE-300) INJECTION 61% IV. No oral contrast. COMPARISON:  None FINDINGS: Lower chest: Subsegmental atelectasis at both lung bases dependently. Noncalcified 6 mm RIGHT lower lobe pulmonary nodule image 16. Lung bases otherwise clear. Hepatobiliary: Gallbladder surgically absent. 12 mm CBD without significant intrahepatic biliary dilatation. Small focus of geographic fatty infiltration of liver medially with an area of mildly increased attenuation at the medial segment LEFT lobe liver adjacent to the falciform fissure likely focal sparing. No additional focal hepatic masses Pancreas: Normal appearance Spleen: Normal appearance Adrenals/Urinary Tract: Adrenal glands normal appearance. Kidneys, ureters, and bladder normal appearance Stomach/Bowel: Normal appendix adjacent to inferior RIGHT lobe liver. Stomach and small bowel loops normal appearance. Bowel wall thickening of descending and sigmoid colon with minimal pericolic stranding compatible with colitis. No  colonic diverticula seen. Mild hyperemia of sigmoid mesocolon. No evidence of perforation or abscess. Proximal colon unremarkable. Vascular/Lymphatic: Atherosclerotic calcifications aorta without aneurysm. No adenopathy. Reproductive: Uterus surgically absent. Nonvisualization of ovaries. Other: No free air or free fluid.  No hernia. Musculoskeletal: No acute osseous findings. IMPRESSION: Long segment of colonic wall thickening involving the descending and sigmoid colon with minimal associated pericolic stranding consistent with colitis; differential diagnosis would include infection and inflammatory bowel disease, with ischemia considered less likely due to distribution and absence of associated severe vascular disease changes. Noncalcified 6 mm RIGHT lower lobe pulmonary nodule, recommendation below. Non-contrast chest CT at 6-12 months is recommended. If the nodule is stable at time of repeat CT, then future CT at 18-24 months (from today's scan) is considered optional for low-risk patients, but is recommended for high-risk patients. This recommendation follows the consensus statement: Guidelines for Management of Incidental Pulmonary Nodules Detected on CT Images: From the Fleischner Society 2017; Radiology 2017; 284:228-243. Electronically Signed   By: Lavonia Dana M.D.   On: 03/01/2018 14:17     Discharge Exam: Vitals:   03/03/18 0942 03/03/18 1050  BP:    Pulse:    Resp:    Temp:    SpO2: 97% 97%   Vitals:   03/03/18 0541 03/03/18 0815 03/03/18 0942 03/03/18 1050  BP: (!) 115/56  Pulse: (!) 58     Resp: 16     Temp: 98.6 F (37 C)     TempSrc: Oral     SpO2: 97% 97% 97% 97%  Weight:      Height:        General: Pt is alert, awake, not in acute distress Cardiovascular: RRR, S1/S2 +, no rubs, no gallops Respiratory: CTA bilaterally, no wheezing, no rhonchi Abdominal: Soft, NT, ND, bowel sounds + Extremities: no edema, no cyanosis    The results of significant diagnostics from  this hospitalization (including imaging, microbiology, ancillary and laboratory) are listed below for reference.     Microbiology: No results found for this or any previous visit (from the past 240 hour(s)).   Labs: BNP (last 3 results) No results for input(s): BNP in the last 8760 hours. Basic Metabolic Panel: Recent Labs  Lab 03/01/18 1156 03/02/18 0719 03/03/18 0616  NA 138 138 143  K 3.6 4.1 3.3*  CL 106 107 109  CO2 24 27 28   GLUCOSE 123* 108* 131*  BUN 16 10 5*  CREATININE 0.67 0.64 0.61  CALCIUM 9.3 8.5* 9.0   Liver Function Tests: Recent Labs  Lab 03/01/18 1156 03/02/18 0719  AST 22 19  ALT 22 19  ALKPHOS 72 58  BILITOT 1.4* 0.7  PROT 6.5 5.6*  ALBUMIN 3.9 3.4*   Recent Labs  Lab 03/01/18 1156  LIPASE 21   No results for input(s): AMMONIA in the last 168 hours. CBC: Recent Labs  Lab 03/01/18 1156 03/01/18 1751 03/02/18 0013 03/02/18 0719 03/03/18 0616  WBC 10.3  --   --  10.1 6.9  NEUTROABS 7.5  --   --   --   --   HGB 12.8 11.5* 10.7* 11.7* 11.0*  HCT 39.9 36.5 34.1* 37.6 34.9*  MCV 96.4  --   --  100.0 97.2  PLT 235  --   --  192 192   Cardiac Enzymes: No results for input(s): CKTOTAL, CKMB, CKMBINDEX, TROPONINI in the last 168 hours. BNP: Invalid input(s): POCBNP CBG: No results for input(s): GLUCAP in the last 168 hours. D-Dimer No results for input(s): DDIMER in the last 72 hours. Hgb A1c No results for input(s): HGBA1C in the last 72 hours. Lipid Profile No results for input(s): CHOL, HDL, LDLCALC, TRIG, CHOLHDL, LDLDIRECT in the last 72 hours. Thyroid function studies No results for input(s): TSH, T4TOTAL, T3FREE, THYROIDAB in the last 72 hours.  Invalid input(s): FREET3 Anemia work up No results for input(s): VITAMINB12, FOLATE, FERRITIN, TIBC, IRON, RETICCTPCT in the last 72 hours. Urinalysis No results found for: COLORURINE, APPEARANCEUR, LABSPEC, [place], GLUCOSEU, HGBUR, BILIRUBINUR, KETONESUR, PROTEINUR, UROBILINOGEN,  NITRITE, LEUKOCYTESUR Sepsis Labs Invalid input(s): PROCALCITONIN,  WBC,  LACTICIDVEN Microbiology No results found for this or any previous visit (from the past 240 hour(s)).   Time coordinating discharge: 35 minutes  SIGNED:   Rodena Goldmann, DO [facility] 03/03/2018, 11:38 AM Pager [phone]  If 7PM-7AM, please contact night-coverage [email or web address] Password [email or web address]

## 2018-03-03 NOTE — Progress Notes (Signed)
Patient given all discharge instructions. Prescriptions sent to [facility] in [place], son in law states he knows where [facility] is and will take her to get the medications. All questions answered and PIV x 2 removed.   Patient discharged home with family.

## 2018-03-08 ENCOUNTER — Emergency Department (HOSPITAL_COMMUNITY)
Admission: EM | Admit: 2018-03-08 | Discharge: 2018-03-08 | Disposition: A | Discharge status: Home / Self Care | Payer: Medicare (Managed Care) | Attending: Emergency Medicine | Admitting: Emergency Medicine

## 2018-03-08 ENCOUNTER — Encounter (HOSPITAL_COMMUNITY): Payer: Self-pay | Admitting: Emergency Medicine

## 2018-03-08 ENCOUNTER — Emergency Department (HOSPITAL_COMMUNITY): Payer: Medicare (Managed Care)

## 2018-03-08 ENCOUNTER — Other Ambulatory Visit: Payer: Self-pay

## 2018-03-08 DIAGNOSIS — R1030 Lower abdominal pain, unspecified: Secondary | ICD-10-CM | POA: Diagnosis not present

## 2018-03-08 DIAGNOSIS — R112 Nausea with vomiting, unspecified: Secondary | ICD-10-CM | POA: Insufficient documentation

## 2018-03-08 DIAGNOSIS — R630 Anorexia: Secondary | ICD-10-CM | POA: Diagnosis not present

## 2018-03-08 DIAGNOSIS — J449 Chronic obstructive pulmonary disease, unspecified: Secondary | ICD-10-CM | POA: Insufficient documentation

## 2018-03-08 DIAGNOSIS — F17210 Nicotine dependence, cigarettes, uncomplicated: Secondary | ICD-10-CM | POA: Insufficient documentation

## 2018-03-08 DIAGNOSIS — R000 Tachycardia, unspecified: Secondary | ICD-10-CM | POA: Insufficient documentation

## 2018-03-08 DIAGNOSIS — Z79899 Other long term (current) drug therapy: Secondary | ICD-10-CM | POA: Diagnosis not present

## 2018-03-08 DIAGNOSIS — R1084 Generalized abdominal pain: Secondary | ICD-10-CM

## 2018-03-08 LAB — COMPREHENSIVE METABOLIC PANEL
ALT: 51 U/L — ABNORMAL HIGH (ref 0–44)
AST: 47 U/L — ABNORMAL HIGH (ref 15–41)
Albumin: 3.9 g/dL (ref 3.5–5.0)
Alkaline Phosphatase: 59 U/L (ref 38–126)
Anion gap: 6 (ref 5–15)
BUN: 7 mg/dL — ABNORMAL LOW (ref 8–23)
CO2: 24 mmol/L (ref 22–32)
Calcium: 9 mg/dL (ref 8.9–10.3)
Chloride: 108 mmol/L (ref 98–111)
Creatinine, Ser: 0.66 mg/dL (ref 0.44–1.00)
GFR calc Af Amer: >60 mL/min (ref >60)
GFR calc non Af Amer: >60 mL/min (ref >60)
Glucose, Bld: 101 mg/dL — ABNORMAL HIGH (ref 70–99)
Potassium: 3.7 mmol/L (ref 3.5–5.1)
Sodium: 138 mmol/L (ref 135–145)
Total Bilirubin: 0.8 mg/dL (ref 0.3–1.2)
Total Protein: 6.5 g/dL (ref 6.5–8.1)

## 2018-03-08 LAB — URINALYSIS, ROUTINE W REFLEX MICROSCOPIC
Bacteria, UA: NONE SEEN
Bilirubin Urine: NEGATIVE
Glucose, UA: NEGATIVE mg/dL
Hgb urine dipstick: NEGATIVE
Ketones, ur: 5 mg/dL — AB
Nitrite: NEGATIVE
Protein, ur: NEGATIVE mg/dL
Specific Gravity, Urine: 1.019 (ref 1.005–1.030)
pH: 5 (ref 5.0–8.0)

## 2018-03-08 LAB — CBC
HCT: 38.2 % (ref 36.0–46.0)
Hemoglobin: 12.4 g/dL (ref 12.0–15.0)
MCH: 31.1 pg (ref 26.0–34.0)
MCHC: 32.5 g/dL (ref 30.0–36.0)
MCV: 95.7 fL (ref 80.0–100.0)
Platelets: 210 10*3/uL (ref 150–400)
RBC: 3.99 MIL/uL (ref 3.87–5.11)
RDW: 13.7 % (ref 11.5–15.5)
WBC: 6.1 10*3/uL (ref 4.0–10.5)
nRBC: 0 % (ref 0.0–0.2)

## 2018-03-08 LAB — LIPASE, BLOOD: Lipase: 27 U/L (ref 11–51)

## 2018-03-08 MED ORDER — MAGNESIUM CITRATE PO SOLN: 1.0000 | Freq: Every day | ORAL | 5 refills | Status: DC | PRN

## 2018-03-08 MED ORDER — SENNOSIDES-DOCUSATE SODIUM 8.6-50 MG PO TABS: 2.0000 | ORAL_TABLET | Freq: Every day | ORAL | 0 refills | Status: AC

## 2018-03-08 MED ORDER — IOPAMIDOL (ISOVUE-300) INJECTION 61%
100.0000 mL | Freq: Once | INTRAVENOUS | Status: AC | PRN
  Administered 2018-03-08: 100 mL via INTRAVENOUS

## 2018-03-08 MED ORDER — SODIUM CHLORIDE 0.9 % IV BOLUS
1000.0000 mL | Freq: Once | INTRAVENOUS | Status: AC
  Administered 2018-03-08: 1000 mL via INTRAVENOUS

## 2018-03-08 MED ORDER — ONDANSETRON HCL 4 MG/2ML IJ SOLN
4.0000 mg | Freq: Once | INTRAMUSCULAR | Status: AC
  Administered 2018-03-08: 4 mg via INTRAVENOUS
  Filled 2018-03-08: qty 2

## 2018-03-08 MED ORDER — MORPHINE SULFATE (PF) 4 MG/ML IV SOLN
4.0000 mg | Freq: Once | INTRAVENOUS | Status: AC
  Administered 2018-03-08: 4 mg via INTRAVENOUS
  Filled 2018-03-08: qty 1

## 2018-03-08 MED ORDER — IOPAMIDOL (ISOVUE-300) INJECTION 61%
30.0000 mL | Freq: Once | INTRAVENOUS | Status: AC | PRN
  Administered 2018-03-08: 30 mL via ORAL

## 2018-03-08 MED ORDER — FLEET ENEMA 7-19 GM/118ML RE ENEM: 1.0000 | ENEMA | Freq: Every day | RECTAL | 0 refills | Status: DC | PRN

## 2018-03-08 NOTE — Discharge Instructions (Signed)
As discussed, your evaluation today has been largely reassuring.  But, it is important that you monitor your condition carefully, and do not hesitate to return to the ED if you develop new, or concerning changes in your condition. ? ?Otherwise, please follow-up with your physician for appropriate ongoing care. ? ?

## 2018-03-08 NOTE — ED Provider Notes (Signed)
[facility] EMERGENCY DEPARTMENT Provider Note   CSN: [id number] Arrival date & time: 03/08/18  1910     History   Chief Complaint Chief Complaint  Patient presents with  . Abdominal Pain    HPI Audrey Kline is a 62 y.o. female.  HPI Patient presents 1 week after being evaluated here for abdominal pain, diagnosed with colitis, now with worsening pain, persistent anorexia, nausea, multiple episodes of vomiting. She denies interval fever, states that in spite of taking all antibiotics as directed, she has become worse instead of better. No diarrhea, and the rectal bleeding has improved, but with persistent nausea, vomiting, worsening pain diffusely throughout her abdomen, which is sharp and severe she presents for repeat evaluation.  Past Medical History:  Diagnosis Date  . COPD (chronic obstructive pulmonary disease) ([place])   . Hypercholesterolemia 2014  . Osteoarthrosis 2013  . Panic attacks 2014  . PTSD (post-traumatic stress disorder) 2014  . Sleep apnea 2014  . Spinal stenosis of lumbar region 2014    Patient Active Problem List   Diagnosis Date Noted  . Colitis 03/01/2018  . COPD (chronic obstructive pulmonary disease) ([place]) 03/01/2018  . PTSD (post-traumatic stress disorder) 03/01/2018  . Spinal stenosis 03/01/2018  . GERD (gastroesophageal reflux disease) 03/01/2018    Past Surgical History:  Procedure Laterality Date  . ABDOMINAL HYSTERECTOMY    . CARPAL TUNNEL RELEASE Right 2015  . CHOLECYSTECTOMY    . [place] Left 2014  . HERNIA REPAIR    . SKIN BIOPSY       OB History   No obstetric history on file.      Home Medications    Prior to Admission medications   Medication Sig Start Date End Date Taking? Authorizing Provider  albuterol (PROVENTIL HFA;VENTOLIN HFA) 108 (90 Base) MCG/ACT inhaler Inhale 2 puffs into the lungs every 4 (four) hours as needed for wheezing or shortness of breath.    [provider]  ALPRAZolam Duanne Moron) 1 MG  tablet Take 1 mg by mouth 3 (three) times daily.    [provider]  cholecalciferol (VITAMIN D3) 25 MCG (1000 UT) tablet Take 1,000 Units by mouth daily.    [provider]  ciprofloxacin (CIPRO) 500 MG tablet Take 1 tablet (500 mg total) by mouth 2 (two) times daily for 8 days. 03/03/18 03/11/18  Manuella Ghazi, Pratik D, DO  cycloSPORINE (RESTASIS) 0.05 % ophthalmic emulsion Place 1 drop into both eyes 2 (two) times daily.    [provider]  dicyclomine (BENTYL) 10 MG capsule Take 1 capsule (10 mg total) by mouth 3 (three) times daily before meals for 7 days. 03/03/18 03/10/18  Manuella Ghazi, Pratik D, DO  DULoxetine (CYMBALTA) 20 MG capsule Take 40 mg by mouth daily.    [provider]  HYDROcodone-acetaminophen (NORCO/VICODIN) 5-325 MG tablet Take 1 tablet by mouth 3 (three) times daily.    [provider]  metroNIDAZOLE (FLAGYL) 500 MG tablet Take 1 tablet (500 mg total) by mouth 3 (three) times daily for 8 days. 03/03/18 03/11/18  Heath Lark D, DO  Multiple Vitamin (MULTIVITAMIN) capsule Take 1 capsule by mouth daily.    [provider]  omeprazole (PRILOSEC) 40 MG capsule Take 40 mg by mouth as needed.    [provider]  ondansetron (ZOFRAN) 4 MG tablet Take 1 tablet (4 mg total) by mouth every 6 (six) hours as needed for nausea. 03/03/18   Manuella Ghazi, Pratik D, DO  tiotropium (SPIRIVA) 18 MCG inhalation capsule Place 2.5 mcg  into inhaler and inhale daily.    [provider]    Family History No family history on file.  Social History Social History   Tobacco Use  . Smoking status: Current Every Day Smoker    Packs/day: 0.50    Years: 25.00    Pack years: 12.50    Types: Cigarettes  . Smokeless tobacco: Never Used  Substance Use Topics  . Alcohol use: Not Currently  . Drug use: Not Currently     Allergies   Nsaids and Latex   Review of Systems Review of Systems  Constitutional:       Per HPI, otherwise negative  HENT:        Per HPI, otherwise negative  Respiratory:       Per HPI, otherwise negative  Cardiovascular:       Per HPI, otherwise negative  Gastrointestinal: Positive for abdominal pain, nausea and vomiting.  Endocrine:       Negative aside from HPI  Genitourinary:       Neg aside from HPI   Musculoskeletal:       Per HPI, otherwise negative  Skin: Negative.   Neurological: Negative for syncope.  Psychiatric/Behavioral: The patient is nervous/anxious.      Physical Exam Updated Vital Signs BP (!) 154/73   Pulse 67   Temp 98.4 F (36.9 C) (Oral)   Resp 18   Ht 5\' 2"  (1.575 m)   Wt 73 kg   SpO2 100%   BMI 29.44 kg/m   Physical Exam Vitals signs and nursing note reviewed.  Constitutional:      General: She is not in acute distress.    Appearance: She is well-developed.     Comments: Uncomfortable appearing female awake and alert  HENT:     Head: Normocephalic and atraumatic.  Eyes:     Conjunctiva/sclera: Conjunctivae normal.  Cardiovascular:     Rate and Rhythm: Regular rhythm. Tachycardia present.  Pulmonary:     Effort: Pulmonary effort is normal. No respiratory distress.     Breath sounds: Normal breath sounds. No stridor.  Abdominal:     General: There is no distension.     Tenderness: There is generalized abdominal tenderness. There is guarding.  Skin:    General: Skin is warm and dry.  Neurological:     Mental Status: She is alert and oriented to person, place, and time.     Cranial Nerves: No cranial nerve deficit.      ED Treatments / Results  Labs (all labs ordered are listed, but only abnormal results are displayed) Labs Reviewed  COMPREHENSIVE METABOLIC PANEL - Abnormal; Notable for the following components:      Result Value   Glucose, Bld 101 (*)    BUN 7 (*)    AST 47 (*)    ALT 51 (*)    All other components within normal limits  URINALYSIS, ROUTINE W REFLEX MICROSCOPIC - Abnormal; Notable for the following components:   Ketones, ur 5 (*)     Leukocytes, UA SMALL (*)    All other components within normal limits  LIPASE, BLOOD  CBC    EKG None  Radiology Ct Abdomen Pelvis W Contrast  Result Date: 03/08/2018 CLINICAL DATA:  Lower abdomen pain. Nausea vomiting for 1 day. Fever. Patient has recent diagnosis of inflammatory change of the descending and sigmoid colon. EXAM: CT ABDOMEN AND PELVIS WITH CONTRAST TECHNIQUE: Multidetector CT imaging of the abdomen and pelvis was performed using the standard protocol  following bolus administration of intravenous contrast. CONTRAST:  80mL ISOVUE-300 IOPAMIDOL (ISOVUE-300) INJECTION 61%, 11mL ISOVUE-300 IOPAMIDOL (ISOVUE-300) INJECTION 61% COMPARISON:  March 01, 2018 FINDINGS: Lower chest: No acute abnormality. Hepatobiliary: Focal low densities identified in the medial aspect of the right lobe liver probably focal fatty infiltration. No other focal liver lesion is identified. Patient status post prior cholecystectomy. The biliary tree is normal. Pancreas: Unremarkable. No pancreatic ductal dilatation or surrounding inflammatory changes. Spleen: Normal in size without focal abnormality. Adrenals/Urinary Tract: Adrenal glands are unremarkable. Kidneys are normal, without renal calculi, focal lesion, or hydronephrosis. Bladder is unremarkable. Stomach/Bowel: The descending and sigmoid colon are normal. The previously noted thickened bowel wall has resolved. There is extensive bowel content identified throughout colon. There is no small bowel obstruction. The appendix is normal. There is a small hiatal hernia. The stomach is otherwise normal. Vascular/Lymphatic: Aortic atherosclerosis. No enlarged abdominal or pelvic lymph nodes. Reproductive: Status post hysterectomy. No adnexal masses. Other: None. Musculoskeletal: No acute or significant osseous findings. IMPRESSION: The colon is normal. The previously noted thickened sigmoid and descending colon are resolved. There is no evidence of  diverticulitis. There is extensive bowel content identified throughout colon suggesting constipation. No bowel obstruction. Electronically Signed   By: Abelardo Diesel M.D.   On: 03/08/2018 22:51    Procedures Procedures (including critical care time)  Medications Ordered in ED Medications  sodium chloride 0.9 % bolus 1,000 mL (0 mLs Intravenous Stopped 03/08/18 2215)  ondansetron (ZOFRAN) injection 4 mg (4 mg Intravenous Given 03/08/18 2109)  morphine 4 MG/ML injection 4 mg (4 mg Intravenous Given 03/08/18 2110)  iopamidol (ISOVUE-300) 61 % injection 30 mL (30 mLs Oral Contrast Given 03/08/18 2138)  iopamidol (ISOVUE-300) 61 % injection 100 mL (100 mLs Intravenous Contrast Given 03/08/18 2222)     Initial Impression / Assessment and Plan / ED Course  I have reviewed the triage vital signs and the nursing notes.  Pertinent labs & imaging results that were available during my care of the patient were reviewed by me and considered in my medical decision making (see chart for details).    Chart review after most recent visit notable for colitis diagnosed on CT scan. 11:34 PM I discussed all findings with the patient, including reassuring labs, CT scan without demonstration for obstruction, or colitis,Rather, with evidence that her infection has improved substantially. Patient is awake, alert, in no distress.  We discussed options for improving her abdominal pain, likely due to constipation, including enema, oral meds, and the patient will be discharged with close outpatient follow-up.    Final Clinical Impressions(s) / ED Diagnoses  Abdominal pain, generalized   Carmin Muskrat, MD 03/08/18 2335

## 2018-03-08 NOTE — ED Triage Notes (Signed)
Sharp abd pain since this am   Recent admission   vomited x 2

## 2019-05-26 DIAGNOSIS — M4800 Spinal stenosis, site unspecified: Secondary | ICD-10-CM | POA: Diagnosis not present

## 2019-05-26 DIAGNOSIS — K219 Gastro-esophageal reflux disease without esophagitis: Secondary | ICD-10-CM | POA: Diagnosis not present

## 2019-05-26 DIAGNOSIS — G4730 Sleep apnea, unspecified: Secondary | ICD-10-CM | POA: Diagnosis not present

## 2019-05-26 DIAGNOSIS — E782 Mixed hyperlipidemia: Secondary | ICD-10-CM | POA: Diagnosis not present

## 2019-05-26 DIAGNOSIS — Z131 Encounter for screening for diabetes mellitus: Secondary | ICD-10-CM | POA: Diagnosis not present

## 2019-05-26 DIAGNOSIS — J440 Chronic obstructive pulmonary disease with acute lower respiratory infection: Secondary | ICD-10-CM | POA: Diagnosis not present

## 2019-06-25 DIAGNOSIS — M25461 Effusion, right knee: Secondary | ICD-10-CM | POA: Diagnosis not present

## 2019-06-25 DIAGNOSIS — Z0000 Encounter for general adult medical examination without abnormal findings: Secondary | ICD-10-CM | POA: Diagnosis not present

## 2019-07-14 DIAGNOSIS — I2510 Atherosclerotic heart disease of native coronary artery without angina pectoris: Secondary | ICD-10-CM | POA: Diagnosis not present

## 2019-07-14 DIAGNOSIS — J439 Emphysema, unspecified: Secondary | ICD-10-CM | POA: Diagnosis not present

## 2019-07-14 DIAGNOSIS — Z122 Encounter for screening for malignant neoplasm of respiratory organs: Secondary | ICD-10-CM | POA: Diagnosis not present

## 2019-07-14 DIAGNOSIS — Z87891 Personal history of nicotine dependence: Secondary | ICD-10-CM | POA: Diagnosis not present

## 2019-07-14 DIAGNOSIS — I700 Atherosclerosis of aorta: Secondary | ICD-10-CM | POA: Diagnosis not present

## 2019-08-24 DIAGNOSIS — E785 Hyperlipidemia, unspecified: Secondary | ICD-10-CM | POA: Diagnosis not present

## 2019-08-24 DIAGNOSIS — I10 Essential (primary) hypertension: Secondary | ICD-10-CM | POA: Diagnosis not present

## 2019-08-24 DIAGNOSIS — J449 Chronic obstructive pulmonary disease, unspecified: Secondary | ICD-10-CM | POA: Diagnosis not present

## 2019-08-24 DIAGNOSIS — I700 Atherosclerosis of aorta: Secondary | ICD-10-CM | POA: Diagnosis not present

## 2019-08-24 DIAGNOSIS — I25118 Atherosclerotic heart disease of native coronary artery with other forms of angina pectoris: Secondary | ICD-10-CM | POA: Diagnosis not present

## 2019-09-23 DIAGNOSIS — Z743 Need for continuous supervision: Secondary | ICD-10-CM | POA: Diagnosis not present

## 2019-09-23 DIAGNOSIS — R7303 Prediabetes: Secondary | ICD-10-CM | POA: Diagnosis not present

## 2019-09-23 DIAGNOSIS — J449 Chronic obstructive pulmonary disease, unspecified: Secondary | ICD-10-CM | POA: Diagnosis not present

## 2019-09-23 DIAGNOSIS — R0781 Pleurodynia: Secondary | ICD-10-CM | POA: Diagnosis not present

## 2019-09-23 DIAGNOSIS — Z720 Tobacco use: Secondary | ICD-10-CM | POA: Diagnosis not present

## 2019-09-23 DIAGNOSIS — R0789 Other chest pain: Secondary | ICD-10-CM | POA: Diagnosis not present

## 2019-09-23 DIAGNOSIS — R079 Chest pain, unspecified: Secondary | ICD-10-CM | POA: Diagnosis not present

## 2019-10-01 DIAGNOSIS — R0789 Other chest pain: Secondary | ICD-10-CM | POA: Diagnosis not present

## 2019-10-24 DIAGNOSIS — R079 Chest pain, unspecified: Secondary | ICD-10-CM | POA: Diagnosis not present

## 2019-10-24 DIAGNOSIS — S61512A Laceration without foreign body of left wrist, initial encounter: Secondary | ICD-10-CM | POA: Diagnosis not present

## 2019-10-24 DIAGNOSIS — S6992XA Unspecified injury of left wrist, hand and finger(s), initial encounter: Secondary | ICD-10-CM | POA: Diagnosis not present

## 2019-10-24 DIAGNOSIS — I10 Essential (primary) hypertension: Secondary | ICD-10-CM | POA: Diagnosis not present

## 2019-10-24 DIAGNOSIS — Z634 Disappearance and death of family member: Secondary | ICD-10-CM | POA: Diagnosis not present

## 2019-10-24 DIAGNOSIS — Z79899 Other long term (current) drug therapy: Secondary | ICD-10-CM | POA: Diagnosis not present

## 2019-10-24 DIAGNOSIS — Z743 Need for continuous supervision: Secondary | ICD-10-CM | POA: Diagnosis not present

## 2019-10-24 DIAGNOSIS — J449 Chronic obstructive pulmonary disease, unspecified: Secondary | ICD-10-CM | POA: Diagnosis not present

## 2019-10-24 DIAGNOSIS — R0789 Other chest pain: Secondary | ICD-10-CM | POA: Diagnosis not present

## 2019-10-24 DIAGNOSIS — R58 Hemorrhage, not elsewhere classified: Secondary | ICD-10-CM | POA: Diagnosis not present

## 2019-10-24 DIAGNOSIS — R9431 Abnormal electrocardiogram [ECG] [EKG]: Secondary | ICD-10-CM | POA: Diagnosis not present

## 2019-10-24 DIAGNOSIS — R6889 Other general symptoms and signs: Secondary | ICD-10-CM | POA: Diagnosis not present

## 2019-11-23 DIAGNOSIS — G894 Chronic pain syndrome: Secondary | ICD-10-CM | POA: Diagnosis not present

## 2019-11-23 DIAGNOSIS — M25561 Pain in right knee: Secondary | ICD-10-CM | POA: Diagnosis not present

## 2019-11-23 DIAGNOSIS — M545 Low back pain: Secondary | ICD-10-CM | POA: Diagnosis not present

## 2019-11-23 DIAGNOSIS — M79602 Pain in left arm: Secondary | ICD-10-CM | POA: Diagnosis not present

## 2019-11-23 DIAGNOSIS — M542 Cervicalgia: Secondary | ICD-10-CM | POA: Diagnosis not present

## 2019-11-25 DIAGNOSIS — M19042 Primary osteoarthritis, left hand: Secondary | ICD-10-CM | POA: Diagnosis not present

## 2019-11-25 DIAGNOSIS — K219 Gastro-esophageal reflux disease without esophagitis: Secondary | ICD-10-CM | POA: Diagnosis not present

## 2019-11-25 DIAGNOSIS — J449 Chronic obstructive pulmonary disease, unspecified: Secondary | ICD-10-CM | POA: Diagnosis not present

## 2019-11-26 ENCOUNTER — Other Ambulatory Visit (HOSPITAL_COMMUNITY): Payer: Self-pay | Admitting: Neurology

## 2019-11-26 DIAGNOSIS — M542 Cervicalgia: Secondary | ICD-10-CM

## 2019-11-26 DIAGNOSIS — M545 Low back pain, unspecified: Secondary | ICD-10-CM

## 2019-12-04 DIAGNOSIS — J449 Chronic obstructive pulmonary disease, unspecified: Secondary | ICD-10-CM | POA: Diagnosis not present

## 2019-12-04 DIAGNOSIS — Z79899 Other long term (current) drug therapy: Secondary | ICD-10-CM | POA: Diagnosis not present

## 2019-12-04 DIAGNOSIS — Z9049 Acquired absence of other specified parts of digestive tract: Secondary | ICD-10-CM | POA: Diagnosis not present

## 2019-12-04 DIAGNOSIS — Z91040 Latex allergy status: Secondary | ICD-10-CM | POA: Diagnosis not present

## 2019-12-04 DIAGNOSIS — Q438 Other specified congenital malformations of intestine: Secondary | ICD-10-CM | POA: Diagnosis not present

## 2019-12-04 DIAGNOSIS — Z886 Allergy status to analgesic agent status: Secondary | ICD-10-CM | POA: Diagnosis not present

## 2019-12-04 DIAGNOSIS — Z743 Need for continuous supervision: Secondary | ICD-10-CM | POA: Diagnosis not present

## 2019-12-04 DIAGNOSIS — R109 Unspecified abdominal pain: Secondary | ICD-10-CM | POA: Diagnosis not present

## 2019-12-04 DIAGNOSIS — I700 Atherosclerosis of aorta: Secondary | ICD-10-CM | POA: Diagnosis not present

## 2019-12-04 DIAGNOSIS — K659 Peritonitis, unspecified: Secondary | ICD-10-CM | POA: Diagnosis not present

## 2019-12-04 DIAGNOSIS — R1011 Right upper quadrant pain: Secondary | ICD-10-CM | POA: Diagnosis not present

## 2019-12-04 DIAGNOSIS — I10 Essential (primary) hypertension: Secondary | ICD-10-CM | POA: Diagnosis not present

## 2019-12-05 DIAGNOSIS — R109 Unspecified abdominal pain: Secondary | ICD-10-CM | POA: Diagnosis not present

## 2019-12-10 DIAGNOSIS — M10042 Idiopathic gout, left hand: Secondary | ICD-10-CM | POA: Diagnosis not present

## 2019-12-10 DIAGNOSIS — L0390 Cellulitis, unspecified: Secondary | ICD-10-CM | POA: Diagnosis not present

## 2019-12-10 DIAGNOSIS — K659 Peritonitis, unspecified: Secondary | ICD-10-CM | POA: Diagnosis not present

## 2019-12-21 DIAGNOSIS — M79602 Pain in left arm: Secondary | ICD-10-CM | POA: Diagnosis not present

## 2019-12-21 DIAGNOSIS — M25561 Pain in right knee: Secondary | ICD-10-CM | POA: Diagnosis not present

## 2019-12-21 DIAGNOSIS — M545 Low back pain, unspecified: Secondary | ICD-10-CM | POA: Diagnosis not present

## 2019-12-21 DIAGNOSIS — M542 Cervicalgia: Secondary | ICD-10-CM | POA: Diagnosis not present

## 2020-01-07 DIAGNOSIS — M5412 Radiculopathy, cervical region: Secondary | ICD-10-CM | POA: Diagnosis not present

## 2020-01-07 DIAGNOSIS — M353 Polymyalgia rheumatica: Secondary | ICD-10-CM | POA: Diagnosis not present

## 2020-01-07 DIAGNOSIS — G629 Polyneuropathy, unspecified: Secondary | ICD-10-CM | POA: Diagnosis not present

## 2020-01-18 DIAGNOSIS — M25561 Pain in right knee: Secondary | ICD-10-CM | POA: Diagnosis not present

## 2020-01-18 DIAGNOSIS — M545 Low back pain, unspecified: Secondary | ICD-10-CM | POA: Diagnosis not present

## 2020-01-18 DIAGNOSIS — M79602 Pain in left arm: Secondary | ICD-10-CM | POA: Diagnosis not present

## 2020-01-18 DIAGNOSIS — M542 Cervicalgia: Secondary | ICD-10-CM | POA: Diagnosis not present

## 2020-01-19 DIAGNOSIS — R9431 Abnormal electrocardiogram [ECG] [EKG]: Secondary | ICD-10-CM | POA: Diagnosis not present

## 2020-01-19 DIAGNOSIS — Z91040 Latex allergy status: Secondary | ICD-10-CM | POA: Diagnosis not present

## 2020-01-19 DIAGNOSIS — F17200 Nicotine dependence, unspecified, uncomplicated: Secondary | ICD-10-CM | POA: Diagnosis not present

## 2020-01-19 DIAGNOSIS — Z79899 Other long term (current) drug therapy: Secondary | ICD-10-CM | POA: Diagnosis not present

## 2020-01-19 DIAGNOSIS — I10 Essential (primary) hypertension: Secondary | ICD-10-CM | POA: Diagnosis not present

## 2020-01-19 DIAGNOSIS — Z886 Allergy status to analgesic agent status: Secondary | ICD-10-CM | POA: Diagnosis not present

## 2020-01-19 DIAGNOSIS — M25562 Pain in left knee: Secondary | ICD-10-CM | POA: Diagnosis not present

## 2020-01-19 DIAGNOSIS — M542 Cervicalgia: Secondary | ICD-10-CM | POA: Diagnosis not present

## 2020-01-19 DIAGNOSIS — M25561 Pain in right knee: Secondary | ICD-10-CM | POA: Diagnosis not present

## 2020-01-19 DIAGNOSIS — J449 Chronic obstructive pulmonary disease, unspecified: Secondary | ICD-10-CM | POA: Diagnosis not present

## 2020-01-19 DIAGNOSIS — G8929 Other chronic pain: Secondary | ICD-10-CM | POA: Diagnosis not present

## 2020-01-19 DIAGNOSIS — M2550 Pain in unspecified joint: Secondary | ICD-10-CM | POA: Diagnosis not present

## 2020-01-21 DIAGNOSIS — M25512 Pain in left shoulder: Secondary | ICD-10-CM | POA: Diagnosis not present

## 2020-01-21 DIAGNOSIS — M19012 Primary osteoarthritis, left shoulder: Secondary | ICD-10-CM | POA: Diagnosis not present

## 2020-01-21 DIAGNOSIS — M25511 Pain in right shoulder: Secondary | ICD-10-CM | POA: Diagnosis not present

## 2020-01-21 DIAGNOSIS — M19011 Primary osteoarthritis, right shoulder: Secondary | ICD-10-CM | POA: Diagnosis not present

## 2020-01-21 DIAGNOSIS — M85812 Other specified disorders of bone density and structure, left shoulder: Secondary | ICD-10-CM | POA: Diagnosis not present

## 2020-01-21 DIAGNOSIS — M542 Cervicalgia: Secondary | ICD-10-CM | POA: Diagnosis not present

## 2020-02-08 ENCOUNTER — Other Ambulatory Visit: Payer: Self-pay | Admitting: Neurology

## 2020-02-08 DIAGNOSIS — M5412 Radiculopathy, cervical region: Secondary | ICD-10-CM

## 2020-02-17 DIAGNOSIS — M542 Cervicalgia: Secondary | ICD-10-CM | POA: Diagnosis not present

## 2020-02-17 DIAGNOSIS — M79602 Pain in left arm: Secondary | ICD-10-CM | POA: Diagnosis not present

## 2020-02-17 DIAGNOSIS — M545 Low back pain, unspecified: Secondary | ICD-10-CM | POA: Diagnosis not present

## 2020-02-17 DIAGNOSIS — M25561 Pain in right knee: Secondary | ICD-10-CM | POA: Diagnosis not present

## 2020-02-18 ENCOUNTER — Ambulatory Visit (HOSPITAL_COMMUNITY): Admission: RE | Admit: 2020-02-18 | Payer: Medicare Other | Source: Ambulatory Visit

## 2020-02-18 ENCOUNTER — Encounter (HOSPITAL_COMMUNITY): Payer: Self-pay

## 2020-02-23 DIAGNOSIS — F17210 Nicotine dependence, cigarettes, uncomplicated: Secondary | ICD-10-CM | POA: Diagnosis not present

## 2020-02-23 DIAGNOSIS — J439 Emphysema, unspecified: Secondary | ICD-10-CM | POA: Diagnosis not present

## 2020-02-23 DIAGNOSIS — M47814 Spondylosis without myelopathy or radiculopathy, thoracic region: Secondary | ICD-10-CM | POA: Diagnosis not present

## 2020-02-23 DIAGNOSIS — J432 Centrilobular emphysema: Secondary | ICD-10-CM | POA: Diagnosis not present

## 2020-02-23 DIAGNOSIS — I700 Atherosclerosis of aorta: Secondary | ICD-10-CM | POA: Diagnosis not present

## 2020-02-23 DIAGNOSIS — I2510 Atherosclerotic heart disease of native coronary artery without angina pectoris: Secondary | ICD-10-CM | POA: Diagnosis not present

## 2020-03-07 DIAGNOSIS — M5011 Cervical disc disorder with radiculopathy,  high cervical region: Secondary | ICD-10-CM | POA: Diagnosis not present

## 2020-03-07 DIAGNOSIS — M4802 Spinal stenosis, cervical region: Secondary | ICD-10-CM | POA: Diagnosis not present

## 2020-03-07 DIAGNOSIS — M50123 Cervical disc disorder at C6-C7 level with radiculopathy: Secondary | ICD-10-CM | POA: Diagnosis not present

## 2020-03-07 DIAGNOSIS — M4722 Other spondylosis with radiculopathy, cervical region: Secondary | ICD-10-CM | POA: Diagnosis not present

## 2020-03-09 ENCOUNTER — Other Ambulatory Visit: Payer: Self-pay | Admitting: Neurology

## 2020-03-09 DIAGNOSIS — M5412 Radiculopathy, cervical region: Secondary | ICD-10-CM

## 2020-03-17 ENCOUNTER — Other Ambulatory Visit: Payer: Medicare Other

## 2020-04-01 ENCOUNTER — Other Ambulatory Visit: Payer: Self-pay

## 2020-04-01 ENCOUNTER — Ambulatory Visit
Admission: RE | Admit: 2020-04-01 | Discharge: 2020-04-01 | Disposition: A | Discharge status: Home / Self Care | Payer: Medicare Other | Source: Ambulatory Visit | Attending: Neurology | Admitting: Neurology

## 2020-04-01 DIAGNOSIS — M5412 Radiculopathy, cervical region: Secondary | ICD-10-CM | POA: Diagnosis not present

## 2020-04-01 MED ORDER — TRIAMCINOLONE ACETONIDE 40 MG/ML IJ SUSP (RADIOLOGY)
60.0000 mg | Freq: Once | INTRAMUSCULAR | Status: AC
  Administered 2020-04-01: 60 mg via EPIDURAL

## 2020-04-01 MED ORDER — IOPAMIDOL (ISOVUE-M 300) INJECTION 61%
1.0000 mL | Freq: Once | INTRAMUSCULAR | Status: AC
  Administered 2020-04-01: 1 mL via EPIDURAL

## 2020-04-01 NOTE — Discharge Instructions (Signed)

## 2020-04-07 DIAGNOSIS — I10 Essential (primary) hypertension: Secondary | ICD-10-CM | POA: Diagnosis not present

## 2020-04-07 DIAGNOSIS — I700 Atherosclerosis of aorta: Secondary | ICD-10-CM | POA: Diagnosis not present

## 2020-04-07 DIAGNOSIS — M10042 Idiopathic gout, left hand: Secondary | ICD-10-CM | POA: Diagnosis not present

## 2020-05-06 DIAGNOSIS — M25561 Pain in right knee: Secondary | ICD-10-CM | POA: Diagnosis not present

## 2020-05-06 DIAGNOSIS — M5412 Radiculopathy, cervical region: Secondary | ICD-10-CM | POA: Diagnosis not present

## 2020-05-06 DIAGNOSIS — M545 Low back pain, unspecified: Secondary | ICD-10-CM | POA: Diagnosis not present

## 2020-05-06 DIAGNOSIS — M79602 Pain in left arm: Secondary | ICD-10-CM | POA: Diagnosis not present

## 2020-05-06 DIAGNOSIS — M542 Cervicalgia: Secondary | ICD-10-CM | POA: Diagnosis not present

## 2020-05-06 DIAGNOSIS — M79642 Pain in left hand: Secondary | ICD-10-CM | POA: Diagnosis not present

## 2020-05-12 ENCOUNTER — Other Ambulatory Visit: Payer: Self-pay | Admitting: Neurology

## 2020-05-12 DIAGNOSIS — M5412 Radiculopathy, cervical region: Secondary | ICD-10-CM

## 2020-05-17 ENCOUNTER — Other Ambulatory Visit: Payer: Self-pay

## 2020-05-17 ENCOUNTER — Ambulatory Visit
Admission: RE | Admit: 2020-05-17 | Discharge: 2020-05-17 | Disposition: A | Discharge status: Home / Self Care | Payer: Medicare Other | Source: Ambulatory Visit | Attending: Neurology | Admitting: Neurology

## 2020-05-17 DIAGNOSIS — M5412 Radiculopathy, cervical region: Secondary | ICD-10-CM

## 2020-05-17 DIAGNOSIS — M47812 Spondylosis without myelopathy or radiculopathy, cervical region: Secondary | ICD-10-CM | POA: Diagnosis not present

## 2020-05-17 MED ORDER — IOPAMIDOL (ISOVUE-M 300) INJECTION 61%
1.0000 mL | Freq: Once | INTRAMUSCULAR | Status: AC
  Administered 2020-05-17: 1 mL via EPIDURAL

## 2020-05-17 MED ORDER — TRIAMCINOLONE ACETONIDE 40 MG/ML IJ SUSP (RADIOLOGY)
60.0000 mg | Freq: Once | INTRAMUSCULAR | Status: AC
  Administered 2020-05-17: 60 mg via EPIDURAL

## 2020-05-17 NOTE — Discharge Instructions (Signed)

## 2020-05-23 DIAGNOSIS — J449 Chronic obstructive pulmonary disease, unspecified: Secondary | ICD-10-CM | POA: Diagnosis not present

## 2020-05-23 DIAGNOSIS — G8929 Other chronic pain: Secondary | ICD-10-CM | POA: Diagnosis not present

## 2020-05-23 DIAGNOSIS — I700 Atherosclerosis of aorta: Secondary | ICD-10-CM | POA: Diagnosis not present

## 2020-05-23 DIAGNOSIS — Z9049 Acquired absence of other specified parts of digestive tract: Secondary | ICD-10-CM | POA: Diagnosis not present

## 2020-05-23 DIAGNOSIS — R220 Localized swelling, mass and lump, head: Secondary | ICD-10-CM | POA: Diagnosis not present

## 2020-05-23 DIAGNOSIS — Z743 Need for continuous supervision: Secondary | ICD-10-CM | POA: Diagnosis not present

## 2020-05-23 DIAGNOSIS — I10 Essential (primary) hypertension: Secondary | ICD-10-CM | POA: Diagnosis not present

## 2020-05-23 DIAGNOSIS — K5900 Constipation, unspecified: Secondary | ICD-10-CM | POA: Diagnosis not present

## 2020-05-23 DIAGNOSIS — R519 Headache, unspecified: Secondary | ICD-10-CM | POA: Diagnosis not present

## 2020-05-23 DIAGNOSIS — M549 Dorsalgia, unspecified: Secondary | ICD-10-CM | POA: Diagnosis not present

## 2020-05-23 DIAGNOSIS — Z91040 Latex allergy status: Secondary | ICD-10-CM | POA: Diagnosis not present

## 2020-05-23 DIAGNOSIS — Z79899 Other long term (current) drug therapy: Secondary | ICD-10-CM | POA: Diagnosis not present

## 2020-05-23 DIAGNOSIS — R109 Unspecified abdominal pain: Secondary | ICD-10-CM | POA: Diagnosis not present

## 2020-05-23 DIAGNOSIS — R1032 Left lower quadrant pain: Secondary | ICD-10-CM | POA: Diagnosis not present

## 2020-06-01 DIAGNOSIS — M542 Cervicalgia: Secondary | ICD-10-CM | POA: Diagnosis not present

## 2020-06-01 DIAGNOSIS — M545 Low back pain, unspecified: Secondary | ICD-10-CM | POA: Diagnosis not present

## 2020-06-03 DIAGNOSIS — M25561 Pain in right knee: Secondary | ICD-10-CM | POA: Diagnosis not present

## 2020-06-03 DIAGNOSIS — M79602 Pain in left arm: Secondary | ICD-10-CM | POA: Diagnosis not present

## 2020-06-03 DIAGNOSIS — M545 Low back pain, unspecified: Secondary | ICD-10-CM | POA: Diagnosis not present

## 2020-06-03 DIAGNOSIS — M542 Cervicalgia: Secondary | ICD-10-CM | POA: Diagnosis not present

## 2020-06-03 DIAGNOSIS — M5412 Radiculopathy, cervical region: Secondary | ICD-10-CM | POA: Diagnosis not present

## 2020-06-03 DIAGNOSIS — M79642 Pain in left hand: Secondary | ICD-10-CM | POA: Diagnosis not present

## 2020-06-14 DIAGNOSIS — M542 Cervicalgia: Secondary | ICD-10-CM | POA: Diagnosis not present

## 2020-07-06 DIAGNOSIS — I10 Essential (primary) hypertension: Secondary | ICD-10-CM | POA: Diagnosis not present

## 2020-07-06 DIAGNOSIS — M5412 Radiculopathy, cervical region: Secondary | ICD-10-CM | POA: Diagnosis not present

## 2020-07-14 DIAGNOSIS — E785 Hyperlipidemia, unspecified: Secondary | ICD-10-CM | POA: Diagnosis not present

## 2020-07-14 DIAGNOSIS — I10 Essential (primary) hypertension: Secondary | ICD-10-CM | POA: Diagnosis not present

## 2020-07-14 DIAGNOSIS — I700 Atherosclerosis of aorta: Secondary | ICD-10-CM | POA: Diagnosis not present

## 2020-07-14 DIAGNOSIS — Z0000 Encounter for general adult medical examination without abnormal findings: Secondary | ICD-10-CM | POA: Diagnosis not present

## 2020-07-14 DIAGNOSIS — J449 Chronic obstructive pulmonary disease, unspecified: Secondary | ICD-10-CM | POA: Diagnosis not present

## 2020-07-14 DIAGNOSIS — M10042 Idiopathic gout, left hand: Secondary | ICD-10-CM | POA: Diagnosis not present

## 2020-08-10 DIAGNOSIS — M542 Cervicalgia: Secondary | ICD-10-CM | POA: Diagnosis not present

## 2020-08-10 DIAGNOSIS — M797 Fibromyalgia: Secondary | ICD-10-CM | POA: Diagnosis not present

## 2020-08-10 DIAGNOSIS — M79602 Pain in left arm: Secondary | ICD-10-CM | POA: Diagnosis not present

## 2020-08-10 DIAGNOSIS — M5412 Radiculopathy, cervical region: Secondary | ICD-10-CM | POA: Diagnosis not present

## 2020-08-10 DIAGNOSIS — Z79899 Other long term (current) drug therapy: Secondary | ICD-10-CM | POA: Diagnosis not present

## 2020-08-10 DIAGNOSIS — M79642 Pain in left hand: Secondary | ICD-10-CM | POA: Diagnosis not present

## 2020-08-10 DIAGNOSIS — M545 Low back pain, unspecified: Secondary | ICD-10-CM | POA: Diagnosis not present

## 2020-08-10 DIAGNOSIS — M25561 Pain in right knee: Secondary | ICD-10-CM | POA: Diagnosis not present

## 2020-09-14 DIAGNOSIS — Z79899 Other long term (current) drug therapy: Secondary | ICD-10-CM | POA: Diagnosis not present

## 2020-09-14 DIAGNOSIS — M8580 Other specified disorders of bone density and structure, unspecified site: Secondary | ICD-10-CM | POA: Diagnosis not present

## 2020-09-14 DIAGNOSIS — M130 Polyarthritis, unspecified: Secondary | ICD-10-CM | POA: Diagnosis not present

## 2020-09-14 DIAGNOSIS — D474 Osteomyelofibrosis: Secondary | ICD-10-CM | POA: Diagnosis not present

## 2020-09-14 DIAGNOSIS — E559 Vitamin D deficiency, unspecified: Secondary | ICD-10-CM | POA: Diagnosis not present

## 2020-09-27 ENCOUNTER — Other Ambulatory Visit: Payer: Self-pay | Admitting: Neurology

## 2020-09-27 DIAGNOSIS — M79602 Pain in left arm: Secondary | ICD-10-CM | POA: Diagnosis not present

## 2020-09-27 DIAGNOSIS — M79642 Pain in left hand: Secondary | ICD-10-CM | POA: Diagnosis not present

## 2020-09-27 DIAGNOSIS — M545 Low back pain, unspecified: Secondary | ICD-10-CM | POA: Diagnosis not present

## 2020-09-27 DIAGNOSIS — M5412 Radiculopathy, cervical region: Secondary | ICD-10-CM

## 2020-09-27 DIAGNOSIS — M25511 Pain in right shoulder: Secondary | ICD-10-CM | POA: Diagnosis not present

## 2020-09-27 DIAGNOSIS — M797 Fibromyalgia: Secondary | ICD-10-CM | POA: Diagnosis not present

## 2020-09-27 DIAGNOSIS — M25561 Pain in right knee: Secondary | ICD-10-CM | POA: Diagnosis not present

## 2020-09-27 DIAGNOSIS — M542 Cervicalgia: Secondary | ICD-10-CM | POA: Diagnosis not present

## 2020-09-29 ENCOUNTER — Encounter: Payer: Self-pay | Admitting: Orthopedic Surgery

## 2020-10-10 ENCOUNTER — Other Ambulatory Visit: Payer: Medicare Other

## 2020-10-19 ENCOUNTER — Other Ambulatory Visit: Payer: Self-pay

## 2020-10-19 ENCOUNTER — Ambulatory Visit
Admission: RE | Admit: 2020-10-19 | Discharge: 2020-10-19 | Disposition: A | Discharge status: Home / Self Care | Payer: Medicare Other | Source: Ambulatory Visit | Attending: Neurology | Admitting: Neurology

## 2020-10-19 DIAGNOSIS — M50123 Cervical disc disorder at C6-C7 level with radiculopathy: Secondary | ICD-10-CM | POA: Diagnosis not present

## 2020-10-19 DIAGNOSIS — M5412 Radiculopathy, cervical region: Secondary | ICD-10-CM

## 2020-10-19 DIAGNOSIS — M50122 Cervical disc disorder at C5-C6 level with radiculopathy: Secondary | ICD-10-CM | POA: Diagnosis not present

## 2020-10-19 MED ORDER — TRIAMCINOLONE ACETONIDE 40 MG/ML IJ SUSP (RADIOLOGY)
60.0000 mg | Freq: Once | INTRAMUSCULAR | Status: AC
  Administered 2020-10-19: 60 mg via EPIDURAL

## 2020-10-19 MED ORDER — IOPAMIDOL (ISOVUE-M 300) INJECTION 61%
1.0000 mL | Freq: Once | INTRAMUSCULAR | Status: AC
  Administered 2020-10-19: 1 mL via EPIDURAL

## 2020-10-19 NOTE — Discharge Instructions (Signed)

## 2020-10-20 DIAGNOSIS — J449 Chronic obstructive pulmonary disease, unspecified: Secondary | ICD-10-CM | POA: Diagnosis not present

## 2020-10-20 DIAGNOSIS — M10042 Idiopathic gout, left hand: Secondary | ICD-10-CM | POA: Diagnosis not present

## 2020-10-20 DIAGNOSIS — I700 Atherosclerosis of aorta: Secondary | ICD-10-CM | POA: Diagnosis not present

## 2020-10-20 DIAGNOSIS — I10 Essential (primary) hypertension: Secondary | ICD-10-CM | POA: Diagnosis not present

## 2020-10-20 DIAGNOSIS — E785 Hyperlipidemia, unspecified: Secondary | ICD-10-CM | POA: Diagnosis not present

## 2020-10-25 ENCOUNTER — Other Ambulatory Visit (HOSPITAL_COMMUNITY): Payer: Self-pay | Admitting: Neurology

## 2020-10-25 DIAGNOSIS — M545 Low back pain, unspecified: Secondary | ICD-10-CM | POA: Diagnosis not present

## 2020-10-25 DIAGNOSIS — M542 Cervicalgia: Secondary | ICD-10-CM | POA: Diagnosis not present

## 2020-10-25 DIAGNOSIS — M25561 Pain in right knee: Secondary | ICD-10-CM | POA: Diagnosis not present

## 2020-10-25 DIAGNOSIS — M79642 Pain in left hand: Secondary | ICD-10-CM | POA: Diagnosis not present

## 2020-10-25 DIAGNOSIS — E559 Vitamin D deficiency, unspecified: Secondary | ICD-10-CM | POA: Diagnosis not present

## 2020-10-25 DIAGNOSIS — M5412 Radiculopathy, cervical region: Secondary | ICD-10-CM | POA: Diagnosis not present

## 2020-10-25 DIAGNOSIS — M25511 Pain in right shoulder: Secondary | ICD-10-CM

## 2020-10-25 DIAGNOSIS — M79602 Pain in left arm: Secondary | ICD-10-CM | POA: Diagnosis not present

## 2020-10-25 DIAGNOSIS — M797 Fibromyalgia: Secondary | ICD-10-CM | POA: Diagnosis not present

## 2020-12-26 DIAGNOSIS — M545 Low back pain, unspecified: Secondary | ICD-10-CM | POA: Diagnosis not present

## 2020-12-26 DIAGNOSIS — E559 Vitamin D deficiency, unspecified: Secondary | ICD-10-CM | POA: Diagnosis not present

## 2020-12-26 DIAGNOSIS — M797 Fibromyalgia: Secondary | ICD-10-CM | POA: Diagnosis not present

## 2020-12-26 DIAGNOSIS — M5412 Radiculopathy, cervical region: Secondary | ICD-10-CM | POA: Diagnosis not present

## 2020-12-26 DIAGNOSIS — G603 Idiopathic progressive neuropathy: Secondary | ICD-10-CM | POA: Diagnosis not present

## 2021-01-06 DIAGNOSIS — R059 Cough, unspecified: Secondary | ICD-10-CM | POA: Diagnosis not present

## 2021-01-06 DIAGNOSIS — B349 Viral infection, unspecified: Secondary | ICD-10-CM | POA: Diagnosis not present

## 2021-01-06 DIAGNOSIS — Z20822 Contact with and (suspected) exposure to covid-19: Secondary | ICD-10-CM | POA: Diagnosis not present

## 2021-01-06 DIAGNOSIS — R300 Dysuria: Secondary | ICD-10-CM | POA: Diagnosis not present

## 2021-01-12 DIAGNOSIS — M5417 Radiculopathy, lumbosacral region: Secondary | ICD-10-CM | POA: Diagnosis not present

## 2021-01-23 ENCOUNTER — Other Ambulatory Visit: Payer: Self-pay | Admitting: Neurology

## 2021-01-23 DIAGNOSIS — M542 Cervicalgia: Secondary | ICD-10-CM

## 2021-01-26 DIAGNOSIS — Z0000 Encounter for general adult medical examination without abnormal findings: Secondary | ICD-10-CM | POA: Diagnosis not present

## 2021-01-26 DIAGNOSIS — I10 Essential (primary) hypertension: Secondary | ICD-10-CM | POA: Diagnosis not present

## 2021-01-26 DIAGNOSIS — I700 Atherosclerosis of aorta: Secondary | ICD-10-CM | POA: Diagnosis not present

## 2021-01-26 DIAGNOSIS — J449 Chronic obstructive pulmonary disease, unspecified: Secondary | ICD-10-CM | POA: Diagnosis not present

## 2021-01-26 DIAGNOSIS — M10042 Idiopathic gout, left hand: Secondary | ICD-10-CM | POA: Diagnosis not present

## 2021-01-26 DIAGNOSIS — E785 Hyperlipidemia, unspecified: Secondary | ICD-10-CM | POA: Diagnosis not present

## 2021-01-31 ENCOUNTER — Encounter: Payer: Self-pay | Admitting: Internal Medicine

## 2021-01-31 ENCOUNTER — Ambulatory Visit
Admission: RE | Admit: 2021-01-31 | Discharge: 2021-01-31 | Disposition: A | Discharge status: Home / Self Care | Payer: Medicare Other | Source: Ambulatory Visit | Attending: Neurology | Admitting: Neurology

## 2021-01-31 DIAGNOSIS — M542 Cervicalgia: Secondary | ICD-10-CM | POA: Diagnosis not present

## 2021-01-31 MED ORDER — TRIAMCINOLONE ACETONIDE 40 MG/ML IJ SUSP (RADIOLOGY)
60.0000 mg | Freq: Once | INTRAMUSCULAR | Status: AC
  Administered 2021-01-31: 60 mg via EPIDURAL

## 2021-01-31 MED ORDER — IOPAMIDOL (ISOVUE-M 300) INJECTION 61%
1.0000 mL | Freq: Once | INTRAMUSCULAR | Status: AC | PRN
  Administered 2021-01-31: 1 mL via EPIDURAL

## 2021-01-31 NOTE — Discharge Instructions (Signed)

## 2021-03-09 ENCOUNTER — Ambulatory Visit: Payer: Medicare Other

## 2021-03-29 DIAGNOSIS — R69 Illness, unspecified: Secondary | ICD-10-CM | POA: Diagnosis not present

## 2021-04-11 DIAGNOSIS — Z79899 Other long term (current) drug therapy: Secondary | ICD-10-CM | POA: Diagnosis not present

## 2021-04-11 DIAGNOSIS — G603 Idiopathic progressive neuropathy: Secondary | ICD-10-CM | POA: Diagnosis not present

## 2021-04-11 DIAGNOSIS — M5412 Radiculopathy, cervical region: Secondary | ICD-10-CM | POA: Diagnosis not present

## 2021-04-11 DIAGNOSIS — M542 Cervicalgia: Secondary | ICD-10-CM | POA: Diagnosis not present

## 2021-04-11 DIAGNOSIS — M5451 Vertebrogenic low back pain: Secondary | ICD-10-CM | POA: Diagnosis not present

## 2021-04-11 DIAGNOSIS — M5417 Radiculopathy, lumbosacral region: Secondary | ICD-10-CM | POA: Diagnosis not present

## 2021-04-11 DIAGNOSIS — M25511 Pain in right shoulder: Secondary | ICD-10-CM | POA: Diagnosis not present

## 2021-04-11 DIAGNOSIS — G629 Polyneuropathy, unspecified: Secondary | ICD-10-CM | POA: Diagnosis not present

## 2021-04-11 DIAGNOSIS — M353 Polymyalgia rheumatica: Secondary | ICD-10-CM | POA: Diagnosis not present

## 2021-04-11 DIAGNOSIS — R69 Illness, unspecified: Secondary | ICD-10-CM | POA: Diagnosis not present

## 2021-04-11 DIAGNOSIS — M545 Low back pain, unspecified: Secondary | ICD-10-CM | POA: Diagnosis not present

## 2021-04-11 DIAGNOSIS — M25561 Pain in right knee: Secondary | ICD-10-CM | POA: Diagnosis not present

## 2021-05-15 DIAGNOSIS — M797 Fibromyalgia: Secondary | ICD-10-CM | POA: Diagnosis not present

## 2021-05-15 DIAGNOSIS — M5451 Vertebrogenic low back pain: Secondary | ICD-10-CM | POA: Diagnosis not present

## 2021-05-15 DIAGNOSIS — M5417 Radiculopathy, lumbosacral region: Secondary | ICD-10-CM | POA: Diagnosis not present

## 2021-05-15 DIAGNOSIS — G603 Idiopathic progressive neuropathy: Secondary | ICD-10-CM | POA: Diagnosis not present

## 2021-05-15 DIAGNOSIS — M79642 Pain in left hand: Secondary | ICD-10-CM | POA: Diagnosis not present

## 2021-05-15 DIAGNOSIS — J449 Chronic obstructive pulmonary disease, unspecified: Secondary | ICD-10-CM | POA: Diagnosis not present

## 2021-05-15 DIAGNOSIS — W1839XA Other fall on same level, initial encounter: Secondary | ICD-10-CM | POA: Diagnosis not present

## 2021-05-15 DIAGNOSIS — M5412 Radiculopathy, cervical region: Secondary | ICD-10-CM | POA: Diagnosis not present

## 2021-05-15 DIAGNOSIS — I10 Essential (primary) hypertension: Secondary | ICD-10-CM | POA: Diagnosis not present

## 2021-05-15 DIAGNOSIS — M25511 Pain in right shoulder: Secondary | ICD-10-CM | POA: Diagnosis not present

## 2021-05-15 DIAGNOSIS — Z79899 Other long term (current) drug therapy: Secondary | ICD-10-CM | POA: Diagnosis not present

## 2021-05-15 DIAGNOSIS — S0990XA Unspecified injury of head, initial encounter: Secondary | ICD-10-CM | POA: Diagnosis not present

## 2021-05-15 DIAGNOSIS — M542 Cervicalgia: Secondary | ICD-10-CM | POA: Diagnosis not present

## 2021-05-15 DIAGNOSIS — R519 Headache, unspecified: Secondary | ICD-10-CM | POA: Diagnosis not present

## 2021-05-15 DIAGNOSIS — E559 Vitamin D deficiency, unspecified: Secondary | ICD-10-CM | POA: Diagnosis not present

## 2021-05-15 DIAGNOSIS — E785 Hyperlipidemia, unspecified: Secondary | ICD-10-CM | POA: Diagnosis not present

## 2021-05-15 DIAGNOSIS — M545 Low back pain, unspecified: Secondary | ICD-10-CM | POA: Diagnosis not present

## 2021-05-17 DIAGNOSIS — E785 Hyperlipidemia, unspecified: Secondary | ICD-10-CM | POA: Diagnosis not present

## 2021-05-17 DIAGNOSIS — I10 Essential (primary) hypertension: Secondary | ICD-10-CM | POA: Diagnosis not present

## 2021-05-17 DIAGNOSIS — Z0000 Encounter for general adult medical examination without abnormal findings: Secondary | ICD-10-CM | POA: Diagnosis not present

## 2021-05-17 DIAGNOSIS — J449 Chronic obstructive pulmonary disease, unspecified: Secondary | ICD-10-CM | POA: Diagnosis not present

## 2021-05-17 DIAGNOSIS — M10042 Idiopathic gout, left hand: Secondary | ICD-10-CM | POA: Diagnosis not present

## 2021-05-17 DIAGNOSIS — I700 Atherosclerosis of aorta: Secondary | ICD-10-CM | POA: Diagnosis not present

## 2021-06-05 DIAGNOSIS — M545 Low back pain, unspecified: Secondary | ICD-10-CM | POA: Diagnosis not present

## 2021-06-05 DIAGNOSIS — G603 Idiopathic progressive neuropathy: Secondary | ICD-10-CM | POA: Diagnosis not present

## 2021-06-05 DIAGNOSIS — Z79899 Other long term (current) drug therapy: Secondary | ICD-10-CM | POA: Diagnosis not present

## 2021-06-05 DIAGNOSIS — G894 Chronic pain syndrome: Secondary | ICD-10-CM | POA: Diagnosis not present

## 2021-06-05 DIAGNOSIS — M797 Fibromyalgia: Secondary | ICD-10-CM | POA: Diagnosis not present

## 2021-06-05 DIAGNOSIS — G44321 Chronic post-traumatic headache, intractable: Secondary | ICD-10-CM | POA: Diagnosis not present

## 2021-07-07 DIAGNOSIS — L959 Vasculitis limited to the skin, unspecified: Secondary | ICD-10-CM | POA: Diagnosis not present

## 2021-07-12 DIAGNOSIS — G894 Chronic pain syndrome: Secondary | ICD-10-CM | POA: Diagnosis not present

## 2021-07-12 DIAGNOSIS — G44321 Chronic post-traumatic headache, intractable: Secondary | ICD-10-CM | POA: Diagnosis not present

## 2021-07-12 DIAGNOSIS — M5412 Radiculopathy, cervical region: Secondary | ICD-10-CM | POA: Diagnosis not present

## 2021-07-12 DIAGNOSIS — G8324 Monoplegia of upper limb affecting left nondominant side: Secondary | ICD-10-CM | POA: Diagnosis not present

## 2021-07-12 DIAGNOSIS — M542 Cervicalgia: Secondary | ICD-10-CM | POA: Diagnosis not present

## 2021-07-12 DIAGNOSIS — M545 Low back pain, unspecified: Secondary | ICD-10-CM | POA: Diagnosis not present

## 2021-07-12 DIAGNOSIS — Z79899 Other long term (current) drug therapy: Secondary | ICD-10-CM | POA: Diagnosis not present

## 2021-07-12 DIAGNOSIS — M79602 Pain in left arm: Secondary | ICD-10-CM | POA: Diagnosis not present

## 2021-07-13 ENCOUNTER — Other Ambulatory Visit (HOSPITAL_COMMUNITY): Payer: Self-pay | Admitting: Neurology

## 2021-07-13 ENCOUNTER — Other Ambulatory Visit: Payer: Self-pay | Admitting: Neurology

## 2021-07-13 DIAGNOSIS — I700 Atherosclerosis of aorta: Secondary | ICD-10-CM | POA: Diagnosis not present

## 2021-07-13 DIAGNOSIS — J438 Other emphysema: Secondary | ICD-10-CM | POA: Diagnosis not present

## 2021-07-13 DIAGNOSIS — M79642 Pain in left hand: Secondary | ICD-10-CM

## 2021-07-13 DIAGNOSIS — I2510 Atherosclerotic heart disease of native coronary artery without angina pectoris: Secondary | ICD-10-CM | POA: Diagnosis not present

## 2021-07-13 DIAGNOSIS — Z122 Encounter for screening for malignant neoplasm of respiratory organs: Secondary | ICD-10-CM | POA: Diagnosis not present

## 2021-07-13 DIAGNOSIS — J432 Centrilobular emphysema: Secondary | ICD-10-CM | POA: Diagnosis not present

## 2021-07-13 DIAGNOSIS — J9809 Other diseases of bronchus, not elsewhere classified: Secondary | ICD-10-CM | POA: Diagnosis not present

## 2021-07-13 DIAGNOSIS — F17210 Nicotine dependence, cigarettes, uncomplicated: Secondary | ICD-10-CM | POA: Diagnosis not present

## 2021-07-13 DIAGNOSIS — G8324 Monoplegia of upper limb affecting left nondominant side: Secondary | ICD-10-CM

## 2021-07-31 ENCOUNTER — Ambulatory Visit (HOSPITAL_COMMUNITY)
Admission: RE | Admit: 2021-07-31 | Discharge: 2021-07-31 | Disposition: A | Discharge status: Home / Self Care | Payer: Medicare Other | Source: Ambulatory Visit | Attending: Neurology | Admitting: Neurology

## 2021-07-31 DIAGNOSIS — M79642 Pain in left hand: Secondary | ICD-10-CM | POA: Diagnosis not present

## 2021-07-31 DIAGNOSIS — G8324 Monoplegia of upper limb affecting left nondominant side: Secondary | ICD-10-CM | POA: Insufficient documentation

## 2021-07-31 DIAGNOSIS — R519 Headache, unspecified: Secondary | ICD-10-CM | POA: Diagnosis not present

## 2021-08-17 DIAGNOSIS — E785 Hyperlipidemia, unspecified: Secondary | ICD-10-CM | POA: Diagnosis not present

## 2021-08-17 DIAGNOSIS — I700 Atherosclerosis of aorta: Secondary | ICD-10-CM | POA: Diagnosis not present

## 2021-08-17 DIAGNOSIS — I10 Essential (primary) hypertension: Secondary | ICD-10-CM | POA: Diagnosis not present

## 2021-08-17 DIAGNOSIS — I25118 Atherosclerotic heart disease of native coronary artery with other forms of angina pectoris: Secondary | ICD-10-CM | POA: Diagnosis not present

## 2021-08-17 DIAGNOSIS — M150 Primary generalized (osteo)arthritis: Secondary | ICD-10-CM | POA: Diagnosis not present

## 2021-08-30 DIAGNOSIS — I25118 Atherosclerotic heart disease of native coronary artery with other forms of angina pectoris: Secondary | ICD-10-CM | POA: Diagnosis not present

## 2021-08-30 DIAGNOSIS — R0609 Other forms of dyspnea: Secondary | ICD-10-CM | POA: Diagnosis not present

## 2021-08-30 DIAGNOSIS — E782 Mixed hyperlipidemia: Secondary | ICD-10-CM | POA: Diagnosis not present

## 2021-08-30 DIAGNOSIS — I10 Essential (primary) hypertension: Secondary | ICD-10-CM | POA: Diagnosis not present

## 2021-09-06 DIAGNOSIS — M545 Low back pain, unspecified: Secondary | ICD-10-CM | POA: Diagnosis not present

## 2021-09-06 DIAGNOSIS — M353 Polymyalgia rheumatica: Secondary | ICD-10-CM | POA: Diagnosis not present

## 2021-09-06 DIAGNOSIS — R519 Headache, unspecified: Secondary | ICD-10-CM | POA: Diagnosis not present

## 2021-09-06 DIAGNOSIS — G44321 Chronic post-traumatic headache, intractable: Secondary | ICD-10-CM | POA: Diagnosis not present

## 2021-09-06 DIAGNOSIS — Z79899 Other long term (current) drug therapy: Secondary | ICD-10-CM | POA: Diagnosis not present

## 2021-09-06 DIAGNOSIS — G894 Chronic pain syndrome: Secondary | ICD-10-CM | POA: Diagnosis not present

## 2021-09-21 ENCOUNTER — Ambulatory Visit: Payer: Medicare Other

## 2021-09-30 DIAGNOSIS — M549 Dorsalgia, unspecified: Secondary | ICD-10-CM | POA: Diagnosis not present

## 2021-09-30 DIAGNOSIS — R079 Chest pain, unspecified: Secondary | ICD-10-CM | POA: Diagnosis not present

## 2021-09-30 DIAGNOSIS — R110 Nausea: Secondary | ICD-10-CM | POA: Diagnosis not present

## 2021-09-30 DIAGNOSIS — Z91040 Latex allergy status: Secondary | ICD-10-CM | POA: Diagnosis not present

## 2021-09-30 DIAGNOSIS — Z886 Allergy status to analgesic agent status: Secondary | ICD-10-CM | POA: Diagnosis not present

## 2021-09-30 DIAGNOSIS — I2510 Atherosclerotic heart disease of native coronary artery without angina pectoris: Secondary | ICD-10-CM | POA: Diagnosis not present

## 2021-09-30 DIAGNOSIS — I10 Essential (primary) hypertension: Secondary | ICD-10-CM | POA: Diagnosis not present

## 2021-09-30 DIAGNOSIS — R0789 Other chest pain: Secondary | ICD-10-CM | POA: Diagnosis not present

## 2021-09-30 DIAGNOSIS — J449 Chronic obstructive pulmonary disease, unspecified: Secondary | ICD-10-CM | POA: Diagnosis not present

## 2021-09-30 DIAGNOSIS — Z79899 Other long term (current) drug therapy: Secondary | ICD-10-CM | POA: Diagnosis not present

## 2021-09-30 DIAGNOSIS — M79603 Pain in arm, unspecified: Secondary | ICD-10-CM | POA: Diagnosis not present

## 2021-09-30 DIAGNOSIS — Z743 Need for continuous supervision: Secondary | ICD-10-CM | POA: Diagnosis not present

## 2021-09-30 DIAGNOSIS — F17210 Nicotine dependence, cigarettes, uncomplicated: Secondary | ICD-10-CM | POA: Diagnosis not present

## 2021-09-30 DIAGNOSIS — E785 Hyperlipidemia, unspecified: Secondary | ICD-10-CM | POA: Diagnosis not present

## 2021-10-01 DIAGNOSIS — R079 Chest pain, unspecified: Secondary | ICD-10-CM | POA: Diagnosis not present

## 2021-10-03 DIAGNOSIS — I25118 Atherosclerotic heart disease of native coronary artery with other forms of angina pectoris: Secondary | ICD-10-CM | POA: Diagnosis not present

## 2021-10-03 DIAGNOSIS — R079 Chest pain, unspecified: Secondary | ICD-10-CM | POA: Diagnosis not present

## 2021-10-03 DIAGNOSIS — I10 Essential (primary) hypertension: Secondary | ICD-10-CM | POA: Diagnosis not present

## 2021-10-03 DIAGNOSIS — R0609 Other forms of dyspnea: Secondary | ICD-10-CM | POA: Diagnosis not present

## 2021-10-23 DIAGNOSIS — R0789 Other chest pain: Secondary | ICD-10-CM | POA: Diagnosis not present

## 2021-11-02 DIAGNOSIS — Z1231 Encounter for screening mammogram for malignant neoplasm of breast: Secondary | ICD-10-CM | POA: Diagnosis not present

## 2021-11-22 DIAGNOSIS — G44321 Chronic post-traumatic headache, intractable: Secondary | ICD-10-CM | POA: Diagnosis not present

## 2021-11-22 DIAGNOSIS — M545 Low back pain, unspecified: Secondary | ICD-10-CM | POA: Diagnosis not present

## 2021-11-22 DIAGNOSIS — G894 Chronic pain syndrome: Secondary | ICD-10-CM | POA: Diagnosis not present

## 2021-11-22 DIAGNOSIS — R519 Headache, unspecified: Secondary | ICD-10-CM | POA: Diagnosis not present

## 2021-12-14 ENCOUNTER — Telehealth: Payer: Self-pay | Admitting: Internal Medicine

## 2021-12-14 NOTE — Telephone Encounter (Signed)
I returned patient's call and LMOM that we would need a referral from her PCP prior to scheduling her colonoscopy and that she would be getting a questionnaire to complete so we could move forward in getting her scheduled.

## 2021-12-26 ENCOUNTER — Encounter (INDEPENDENT_AMBULATORY_CARE_PROVIDER_SITE_OTHER): Payer: Self-pay | Admitting: *Deleted

## 2022-01-03 ENCOUNTER — Telehealth: Payer: Self-pay | Admitting: *Deleted

## 2022-01-03 NOTE — Patient Outreach (Signed)
  Care Coordination   01/03/2022 Name: Audrey Kline MRN: [id number] DOB: 12-Dec-1955   Care Coordination Outreach Attempts:  An unsuccessful telephone outreach was attempted today to offer the patient information about available care coordination services as a benefit of their health plan.   Follow Up Plan:  Additional outreach attempts will be made to offer the patient care coordination information and services.   Encounter Outcome:  No Answer  Care Coordination Interventions Activated:  No   Care Coordination Interventions:  No, not indicated    Valente David, RN, MSN, [facility] Care Management Care Management Coordinator [phone]

## 2022-01-05 ENCOUNTER — Telehealth: Payer: Self-pay | Admitting: *Deleted

## 2022-01-05 ENCOUNTER — Encounter: Payer: Self-pay | Admitting: *Deleted

## 2022-01-05 ENCOUNTER — Telehealth: Payer: Self-pay

## 2022-01-05 DIAGNOSIS — W19XXXA Unspecified fall, initial encounter: Secondary | ICD-10-CM

## 2022-01-05 NOTE — Patient Outreach (Signed)
  Care Coordination   Initial Visit Note   01/05/2022 Name: Audrey Kline MRN: [id number] DOB: 15-Feb-1956  Audrey Kline is a 67 y.o. year old female who sees Hasanaj, Samul Dada, MD for primary care. I spoke with  Rockie Neighbours by phone today.  What matters to the patients health and wellness today?  Had fall about 6 weeks ago, has been having left sided pain, particularly the leg, since.  Has not reported to PCP office but will do so.  Denies any urgent concerns, encouraged to contact this care manager with questions.      Goals Addressed             This Visit's Progress    Pain management post fall (left leg)       Care Coordination Interventions: Provided written and verbal education re: potential causes of falls and Fall prevention strategies Reviewed medications and discussed potential side effects of medications such as dizziness and frequent urination Advised patient of importance of notifying provider of falls Assessed patients knowledge of fall risk prevention secondary to previously provided education Advised patient to discuss recent fall with provider Assessed social determinant of health barriers Encouraged to call PCP office to report fall and inquire about interventions  Reviewed upcoming appointment with PCP scheduled for 11/16, encouraged to request sooner appointment Discussed use of the ED versus going to Urgent Care, she will consider Urgent care if unable to get PCP appointment Discussed use of DME in effort to decrease fall risk, will have granddaughter take her to purchase cane Referral sent to community resource care guide for assistance with rent (will be increasing more than $100/month starting January)         SDOH assessments and interventions completed:  Yes  SDOH Interventions Today    Flowsheet Row Most Recent Value  SDOH Interventions   Food Insecurity Interventions Intervention Not Indicated  Housing Interventions Other (Comment)   [Community resource care guide]  Transportation Interventions Intervention Not Indicated  Utilities Interventions Intervention Not Indicated        Care Coordination Interventions Activated:  Yes  Care Coordination Interventions:  Yes, provided   Follow up plan: Follow up call scheduled for 11/1    Encounter Outcome:  Pt. Visit Completed   Valente David, RN, MSN, [place] Care Management Care Management Coordinator [phone]

## 2022-01-05 NOTE — Telephone Encounter (Signed)
   Telephone encounter was:  Unsuccessful.  01/05/2022 Name: Audrey Kline MRN: [id number] DOB: 02/20/1956  Unsuccessful outbound call made today to assist with: financial strain  Outreach Attempt:  1st Attempt  A HIPAA compliant voice message was left requesting a return call.  Instructed patient to call back    [place], [place] Management  [phone] [address], [place], [place] [address] Phone: [phone] Email: [email or web address]

## 2022-01-08 ENCOUNTER — Telehealth: Payer: Self-pay

## 2022-01-08 NOTE — Telephone Encounter (Signed)
   Telephone encounter was:  Unsuccessful.  01/08/2022 Name: Audrey Kline MRN: [id number] DOB: 17-Jun-1955  Unsuccessful outbound call made today to assist with:  Financial Difficulties related to financial strain  Outreach Attempt:  2nd Attempt  A HIPAA compliant voice message was left requesting a return call.  Instructed patient to call back    [place], [place] Management  [phone] [address], [place], [place] [address] Phone: [phone] Email: [email or web address]

## 2022-01-10 ENCOUNTER — Telehealth: Payer: Self-pay

## 2022-01-10 ENCOUNTER — Ambulatory Visit: Payer: Self-pay | Admitting: *Deleted

## 2022-01-10 ENCOUNTER — Encounter: Payer: Self-pay | Admitting: *Deleted

## 2022-01-10 NOTE — Patient Outreach (Signed)
  Care Coordination   Follow Up Visit Note   01/10/2022 Name: Audrey Kline MRN: [id number] DOB: [birth year]  Audrey Kline is a 66 y.o. year old female who sees Hasanaj, Samul Dada, MD for primary care. I spoke with  Audrey Kline by phone today.  What matters to the patients health and wellness today?  Fall prevention and pain management s/p fall    Goals Addressed             This Visit's Progress    Pain management post fall (left leg)       Care Coordination Interventions: Advised patient of importance of notifying provider of falls Assessed for falls since last encounter Assessed patients knowledge of fall risk prevention secondary to previously provided education Advised patient to discuss recent fall with provider Assessed social determinant of health barriers Verified PCP appointment for 01/22/22 Advised to reach out to PCP with any new or worsening symptoms or additional falls Discussed that she has approx 3 falls per year. No major injuries so far. Verified that Care Guide has reached out regarding financial strain and that she is going to mail pt some resources Provided with [facility] telephone number [phone] and encouraged to reach out as needed Scheduled [facility] telephone f/u for 02/09/22        SDOH assessments and interventions completed:  Yes  SDOH Interventions Today    Flowsheet Row Most Recent Value  SDOH Interventions   Housing Interventions --  [has been outreached through [id number] order]  Transportation Interventions Intervention Not Indicated  Financial Strain Interventions Other (Comment)  [has been outreached through [id number]]        Care Coordination Interventions Activated:  Yes  Care Coordination Interventions:  Yes, provided   Follow up plan: Follow up call scheduled for 02/09/22 with [facility]    Encounter Outcome:  Pt. Visit Completed   Chong Sicilian, BSN, RN-BC RN Care Coordinator [place]:  [phone] Main #: [phone]

## 2022-01-10 NOTE — Telephone Encounter (Signed)
   Telephone encounter was:  Unsuccessful.  01/10/2022 Name: Audrey Kline MRN: [id number] DOB: Jul 12, 1955  Unsuccessful outbound call made today to assist with:  Financial Difficulties related to Financial strain  Outreach Attempt:  3rd Attempt.  Referral closed unable to contact patient.  A HIPAA compliant voice message was left requesting a return call.  Instructed patient to call back   [place], [place] Management  [phone] [address], [place], [place] [address] Phone: [phone] Email: [email or web address]

## 2022-01-10 NOTE — Telephone Encounter (Signed)
   Telephone encounter was:  Successful.  01/10/2022 Name: [name] MRN: [id number] DOB: 06-13-1955  Audrey Kline is a 66 y.o. year old female who is a primary care patient of Hasanaj, Samul Dada, MD . The community resource team was consulted for assistance with  financial strain  Care guide performed the following interventions: Patient provided with information about care guide support team and interviewed to confirm resource needs.Patient needs food financial and housing resoures for [facility] Patient stated her rent keeps going up and its suppposed to be income based housing. Patient was approved for utility assistance. I will be mailing other resources as requested  Follow Up Plan:  No further follow up planned at this time. The patient has been provided with needed resources.    [place], Care Management  [phone] [address], [place], [place] [address] Phone: [phone] Email: [email or web address]

## 2022-01-18 ENCOUNTER — Encounter (INDEPENDENT_AMBULATORY_CARE_PROVIDER_SITE_OTHER): Payer: Self-pay | Admitting: *Deleted

## 2022-01-18 ENCOUNTER — Telehealth (INDEPENDENT_AMBULATORY_CARE_PROVIDER_SITE_OTHER): Payer: Self-pay | Admitting: *Deleted

## 2022-01-18 MED ORDER — PEG 3350-KCL-NA BICARB-NACL 420 G PO SOLR: 4000.0000 mL | Freq: Once | ORAL | 0 refills | Status: AC

## 2022-01-18 NOTE — Telephone Encounter (Signed)
  Procedure: Colonoscopy  Have you had a colonoscopy before?  2013  Do you have family history of colon cancer  no  Previous colonoscopy with polyps removed? Yes, 2013  Do you have a history colorectal cancer?   no  Are you diabetic?  no  Do you have a prosthetic or mechanical heart valve? no  Do you have a pacemaker/defibrillator?   no  Have you had endocarditis/atrial fibrillation?  no  Do you use supplemental oxygen/CPAP?  no  Have you had joint replacement within the last 12 months?  no  Do you tend to be constipated or have to use laxatives?  no   Do you have history of alcohol use? If yes, how much and how often.  no  Do you have history or are you using drugs? If yes, what do are you  using?  no  Have you ever had a stroke/heart attack?  no  Do you take weight loss medication? no  female patients,: have you had a hysterectomy? yes                              are you post menopausal?  no                              do you still have your menstrual cycle? no    Do you take any blood-thinning medications such as: (Plavix, aspirin, Coumadin, Aggrenox, Brilinta, Xarelto, Eliquis, Pradaxa, Savaysa or Effient)? no  If yes we need the name, milligram, dosage and who is prescribing doctor:               Current Outpatient Medications  Medication Sig Dispense Refill   atorvastatin (LIPITOR) 20 MG tablet Take 20 mg by mouth daily.     DULOXETINE HCL PO Take 90 mg by mouth daily.     lidocaine (LIDODERM) 5 % 1 patch as needed.     metoprolol tartrate (LOPRESSOR) 25 MG tablet Take 25 mg by mouth daily.     albuterol (PROVENTIL HFA;VENTOLIN HFA) 108 (90 Base) MCG/ACT inhaler Inhale 2 puffs into the lungs every 4 (four) hours as needed for wheezing or shortness of breath.     ALPRAZolam (XANAX) 1 MG tablet Take 1 mg by mouth at bedtime.     cholecalciferol (VITAMIN D3) 25 MCG (1000 UT) tablet Take 1,000 Units by mouth daily. With fish oil     cycloSPORINE (RESTASIS) 0.05 %  ophthalmic emulsion Place 1 drop into both eyes 2 (two) times daily.     dicyclomine (BENTYL) 10 MG capsule Take 1 capsule (10 mg total) by mouth 3 (three) times daily before meals for 7 days. 21 capsule 0   HYDROcodone-acetaminophen (NORCO) 10-325 MG tablet Take 1 tablet by mouth 3 (three) times daily.     Multiple Vitamin (MULTIVITAMIN) capsule Take 1 capsule by mouth daily.     omeprazole (PRILOSEC) 40 MG capsule Take 40 mg by mouth as needed.     tiotropium (SPIRIVA) 18 MCG inhalation capsule Place 2.5 mcg into inhaler and inhale daily.     No current facility-administered medications for this visit.    Allergies  Allergen Reactions   Nsaids Diarrhea   Latex Rash

## 2022-01-18 NOTE — Telephone Encounter (Signed)
Referral completed

## 2022-01-18 NOTE — Telephone Encounter (Signed)
Spoke with pt. Scheduled room 1 on 11/30 at 1:45pm. She wanted it done this month if possible. Aware will send instructions to her. Rx for prep sent to pharmacy. Confirmed address/pharmacy   PA approved via [place]. Auth# [id number], DOS: Feb 08, 2022 - Mar 11, 2022

## 2022-01-19 ENCOUNTER — Other Ambulatory Visit: Payer: Self-pay | Admitting: *Deleted

## 2022-01-19 NOTE — Patient Outreach (Signed)
  Care Coordination   01/19/2022  Name: Audrey Kline MRN: [id number] DOB: 1955-10-29   Care Coordination Outreach Attempts:  A second unsuccessful outreach was attempted today to offer the patient with information about available care coordination services as a benefit of their health plan.   HIPAA compliant message left on voicemail, providing contact information for CSW, encouraging patient to return CSW's call at her earliest convenience.  Follow Up Plan:  Additional outreach attempts will be made to offer the patient care coordination information and services.   Encounter Outcome:  No Answer.   Care Coordination Interventions Activated:  No.    Care Coordination Interventions:  No, not indicated.    Nat Christen, BSW, MSW, LCSW  Licensed Education officer, [facility]  Mailing [place] [address] [place], [place] [address] Physical Address-[address], [place], [place] [address] Toll Free Main # [phone] Fax # [phone] Cell # [phone] [email or web address]

## 2022-01-24 ENCOUNTER — Other Ambulatory Visit: Payer: Self-pay | Admitting: *Deleted

## 2022-01-24 NOTE — Patient Outreach (Signed)
  Care Coordination   01/24/2022  Name: Nzinga Ferran MRN: [id number] DOB: Jul 26, 1955   Care Coordination Outreach Attempts:  A third unsuccessful outreach was attempted today to offer the patient with information about available care coordination services as a benefit of their health plan. Unable to leave HIPAA compliant message on voicemail, as mailbox has not been set up.  Follow Up Plan:  No further outreach attempts will be made at this time. We have been unable to contact the patient to offer or enroll patient in care coordination services.  Encounter Outcome:  No Answer.   Care Coordination Interventions Activated:  No.    Care Coordination Interventions:  No, not indicated.    Nat Christen, BSW, MSW, LCSW  Licensed Education officer, environmental [facility] System  Mailing [place] [address], [place], [place] [address] Physical Address[address] [place],  [address] Toll Free Main # [phone] Fax # [phone] Cell # [phone] [email or web address]

## 2022-02-05 DIAGNOSIS — J440 Chronic obstructive pulmonary disease with acute lower respiratory infection: Secondary | ICD-10-CM | POA: Diagnosis not present

## 2022-02-05 DIAGNOSIS — R0789 Other chest pain: Secondary | ICD-10-CM | POA: Diagnosis not present

## 2022-02-05 DIAGNOSIS — I10 Essential (primary) hypertension: Secondary | ICD-10-CM | POA: Diagnosis not present

## 2022-02-05 DIAGNOSIS — M10029 Idiopathic gout, unspecified elbow: Secondary | ICD-10-CM | POA: Diagnosis not present

## 2022-02-05 DIAGNOSIS — E785 Hyperlipidemia, unspecified: Secondary | ICD-10-CM | POA: Diagnosis not present

## 2022-02-08 ENCOUNTER — Ambulatory Visit (HOSPITAL_COMMUNITY): Payer: Medicare Other | Admitting: Anesthesiology

## 2022-02-08 ENCOUNTER — Encounter (HOSPITAL_COMMUNITY): Payer: Self-pay | Admitting: Gastroenterology

## 2022-02-08 ENCOUNTER — Ambulatory Visit (HOSPITAL_BASED_OUTPATIENT_CLINIC_OR_DEPARTMENT_OTHER): Payer: Medicare Other | Admitting: Anesthesiology

## 2022-02-08 ENCOUNTER — Other Ambulatory Visit: Payer: Self-pay

## 2022-02-08 ENCOUNTER — Encounter (HOSPITAL_COMMUNITY)
Admission: RE | Disposition: A | Discharge status: Home / Self Care | Payer: Self-pay | Source: Home / Self Care | Attending: Gastroenterology

## 2022-02-08 ENCOUNTER — Ambulatory Visit (HOSPITAL_COMMUNITY)
Admission: RE | Admit: 2022-02-08 | Discharge: 2022-02-08 | Disposition: A | Discharge status: Home / Self Care | Payer: Medicare Other | Attending: Gastroenterology | Admitting: Gastroenterology

## 2022-02-08 DIAGNOSIS — F17210 Nicotine dependence, cigarettes, uncomplicated: Secondary | ICD-10-CM | POA: Insufficient documentation

## 2022-02-08 DIAGNOSIS — K635 Polyp of colon: Secondary | ICD-10-CM | POA: Insufficient documentation

## 2022-02-08 DIAGNOSIS — D123 Benign neoplasm of transverse colon: Secondary | ICD-10-CM | POA: Insufficient documentation

## 2022-02-08 DIAGNOSIS — J449 Chronic obstructive pulmonary disease, unspecified: Secondary | ICD-10-CM | POA: Insufficient documentation

## 2022-02-08 DIAGNOSIS — K219 Gastro-esophageal reflux disease without esophagitis: Secondary | ICD-10-CM | POA: Insufficient documentation

## 2022-02-08 DIAGNOSIS — M1990 Unspecified osteoarthritis, unspecified site: Secondary | ICD-10-CM | POA: Diagnosis not present

## 2022-02-08 DIAGNOSIS — E7800 Pure hypercholesterolemia, unspecified: Secondary | ICD-10-CM | POA: Insufficient documentation

## 2022-02-08 DIAGNOSIS — K5730 Diverticulosis of large intestine without perforation or abscess without bleeding: Secondary | ICD-10-CM | POA: Diagnosis not present

## 2022-02-08 DIAGNOSIS — Z1211 Encounter for screening for malignant neoplasm of colon: Secondary | ICD-10-CM | POA: Diagnosis not present

## 2022-02-08 DIAGNOSIS — I10 Essential (primary) hypertension: Secondary | ICD-10-CM | POA: Insufficient documentation

## 2022-02-08 DIAGNOSIS — Z79899 Other long term (current) drug therapy: Secondary | ICD-10-CM | POA: Diagnosis not present

## 2022-02-08 DIAGNOSIS — Z860100 Personal history of colon polyps, unspecified: Secondary | ICD-10-CM

## 2022-02-08 DIAGNOSIS — D120 Benign neoplasm of cecum: Secondary | ICD-10-CM

## 2022-02-08 DIAGNOSIS — Z139 Encounter for screening, unspecified: Secondary | ICD-10-CM | POA: Diagnosis not present

## 2022-02-08 DIAGNOSIS — D128 Benign neoplasm of rectum: Secondary | ICD-10-CM | POA: Diagnosis not present

## 2022-02-08 DIAGNOSIS — Z09 Encounter for follow-up examination after completed treatment for conditions other than malignant neoplasm: Secondary | ICD-10-CM

## 2022-02-08 DIAGNOSIS — F419 Anxiety disorder, unspecified: Secondary | ICD-10-CM | POA: Insufficient documentation

## 2022-02-08 DIAGNOSIS — K621 Rectal polyp: Secondary | ICD-10-CM | POA: Insufficient documentation

## 2022-02-08 DIAGNOSIS — G4730 Sleep apnea, unspecified: Secondary | ICD-10-CM | POA: Diagnosis not present

## 2022-02-08 HISTORY — PX: POLYPECTOMY: SHX149

## 2022-02-08 HISTORY — PX: COLONOSCOPY WITH PROPOFOL: SHX5780

## 2022-02-08 LAB — HM COLONOSCOPY

## 2022-02-08 SURGERY — COLONOSCOPY WITH PROPOFOL
Anesthesia: General

## 2022-02-08 MED ORDER — LIDOCAINE HCL (CARDIAC) PF 100 MG/5ML IV SOSY
PREFILLED_SYRINGE | INTRAVENOUS | Status: DC | PRN
  Administered 2022-02-08: 50 mg via INTRAVENOUS

## 2022-02-08 MED ORDER — PROPOFOL 500 MG/50ML IV EMUL
INTRAVENOUS | Status: DC | PRN
  Administered 2022-02-08: 150 ug/kg/min via INTRAVENOUS

## 2022-02-08 MED ORDER — EPHEDRINE SULFATE (PRESSORS) 50 MG/ML IJ SOLN
INTRAMUSCULAR | Status: DC | PRN
  Administered 2022-02-08: 5 mg via INTRAVENOUS
  Administered 2022-02-08: 10 mg via INTRAVENOUS

## 2022-02-08 MED ORDER — STERILE WATER FOR IRRIGATION IR SOLN
Status: DC | PRN
  Administered 2022-02-08: 120 mL

## 2022-02-08 MED ORDER — PROPOFOL 10 MG/ML IV BOLUS
INTRAVENOUS | Status: DC | PRN
  Administered 2022-02-08: 100 mg via INTRAVENOUS

## 2022-02-08 MED ORDER — LACTATED RINGERS IV SOLN
INTRAVENOUS | Status: DC
Start: 2022-02-08 — End: 2022-02-08

## 2022-02-08 NOTE — Anesthesia Preprocedure Evaluation (Addendum)
Anesthesia Evaluation  Patient identified by MRN, date of birth, ID band Patient awake    Reviewed: Allergy & Precautions, H&P , NPO status , Patient's Chart, lab work & pertinent test results, reviewed documented beta blocker date and time   Airway Mallampati: II  TM Distance: >3 FB Neck ROM: Full    Dental  (+) Dental Advisory Given, Missing   Pulmonary shortness of breath and with exertion, sleep apnea (noncomplaint) , COPD,  COPD inhaler, Current Smoker and Patient abstained from smoking.   Pulmonary exam normal breath sounds clear to auscultation       Cardiovascular Exercise Tolerance: Good hypertension, Pt. on home beta blockers and Pt. on medications + DOE  Normal cardiovascular exam Rhythm:Regular Rate:Normal     Neuro/Psych  PSYCHIATRIC DISORDERS Anxiety     negative neurological ROS     GI/Hepatic Neg liver ROS,GERD  Medicated and Controlled,,  Endo/Other  negative endocrine ROS    Renal/GU negative Renal ROS  negative genitourinary   Musculoskeletal  (+) Arthritis  (spinal stenosis), Osteoarthritis,    Abdominal   Peds negative pediatric ROS (+)  Hematology negative hematology ROS (+)   Anesthesia Other Findings Chronic Back pain  Reproductive/Obstetrics negative OB ROS                             Anesthesia Physical Anesthesia Plan  ASA: 3  Anesthesia Plan: General   Post-op Pain Management: Minimal or no pain anticipated   Induction: Intravenous  PONV Risk Score and Plan: Propofol infusion  Airway Management Planned: Nasal Cannula and Natural Airway  Additional Equipment:   Intra-op Plan:   Post-operative Plan:   Informed Consent: I have reviewed the patients History and Physical, chart, labs and discussed the procedure including the risks, benefits and alternatives for the proposed anesthesia with the patient or authorized representative who has indicated  his/her understanding and acceptance.     Dental advisory given  Plan Discussed with: CRNA and Surgeon  Anesthesia Plan Comments:        Anesthesia Quick Evaluation

## 2022-02-08 NOTE — Transfer of Care (Signed)
Immediate Anesthesia Transfer of Care Note  Patient: Audrey Kline  Procedure(s) Performed: COLONOSCOPY WITH PROPOFOL POLYPECTOMY INTESTINAL  Patient Location: Endoscopy Unit  Anesthesia Type:General  Level of Consciousness: awake and alert   Airway & Oxygen Therapy: Patient Spontanous Breathing  Post-op Assessment: Report given to RN and Post -op Vital signs reviewed and stable  Post vital signs: Reviewed and stable  Last Vitals:  Vitals Value Taken Time  BP 85/48 02/08/22 1341  Temp 36.7 C 02/08/22 1341  Pulse 100 02/08/22 1341  Resp 17 02/08/22 1341  SpO2 98 % 02/08/22 1341    Last Pain:  Vitals:   02/08/22 1341  TempSrc: Oral  PainSc: 10-Worst pain ever      Patients Stated Pain Goal: 8 (56/70/14 1030)  Complications: No notable events documented.

## 2022-02-08 NOTE — Op Note (Signed)
[facility] Patient Name: Audrey Kline Procedure Date: 02/08/2022 12:34 PM MRN: [id number] Date of Birth: 05-Jul-1955 Attending MD: Maylon Peppers , , [id number] CSN: [id number] Age: 66 Admit Type: Outpatient Procedure:                Colonoscopy Indications:              Surveillance: Personal history of colonic polyps                            (unknown histology) on last colonoscopy more than 5                            years ago Providers:                Maylon Peppers, [place] Page, Everardo Pacific Referring MD:              Medicines:                Monitored Anesthesia Care Complications:            No immediate complications. Estimated Blood Loss:     Estimated blood loss: none. Procedure:                Pre-Anesthesia Assessment:                           - Prior to the procedure, a History and Physical                            was performed, and patient medications, allergies                            and sensitivities were reviewed. The patient's                            tolerance of previous anesthesia was reviewed.                           - The risks and benefits of the procedure and the                            sedation options and risks were discussed with the                            patient. All questions were answered and informed                            consent was obtained.                           - ASA Grade Assessment: II - A patient with mild                            systemic disease.                           After obtaining informed consent, the colonoscope  was passed under direct vision. Throughout the                            procedure, the patient's blood pressure, pulse, and                            oxygen saturations were monitored continuously. The                            [id number]-[id number] ([id number]) was introduced through the                            anus and advanced to the the cecum,  identified by                            appendiceal orifice and ileocecal valve. The                            colonoscopy was performed without difficulty. The                            patient tolerated the procedure well. The quality                            of the bowel preparation was excellent. Scope In: 1:04:40 PM Scope Out: 1:38:03 PM Scope Withdrawal Time: 0 hours 25 minutes 56 seconds  Total Procedure Duration: 0 hours 33 minutes 23 seconds  Findings:      The perianal and digital rectal examinations were normal.      Three sessile polyps were found in the transverse colon and cecum. The       polyps were 3 to 6 mm in size. These polyps were removed with a cold       snare. Resection and retrieval were complete.      Three sessile polyps were found in the rectum and descending colon. The       polyps were 2 to 8 mm in size. These polyps were removed with a cold       snare. Resection and retrieval were complete.      A few small-mouthed diverticula were found in the sigmoid colon.      The retroflexed view of the distal rectum and anal verge was normal and       showed no anal or rectal abnormalities. Impression:               - Three 3 to 6 mm polyps in the transverse colon                            and in the cecum, removed with a cold snare.                            Resected and retrieved.                           - Three 2 to 8 mm polyps in the rectum and in the  descending colon, removed with a cold snare.                            Resected and retrieved.                           - Diverticulosis in the sigmoid colon.                           - The distal rectum and anal verge are normal on                            retroflexion view. Moderate Sedation:      Per Anesthesia Care Recommendation:           - Discharge patient to home (ambulatory).                           - Resume previous diet.                           - Await  pathology results.                           - Repeat colonoscopy for surveillance based on                            pathology results. Procedure Code(s):        --- Professional ---                           [phone], Colonoscopy, flexible; with removal of                            tumor(s), polyp(s), or other lesion(s) by snare                            technique Diagnosis Code(s):        --- Professional ---                           Z86.010, Personal history of colonic polyps                           D12.3, Benign neoplasm of transverse colon (hepatic                            flexure or splenic flexure)                           D12.0, Benign neoplasm of cecum                           D12.8, Benign neoplasm of rectum                           D12.4, Benign neoplasm of descending colon  K57.30, Diverticulosis of large intestine without                            perforation or abscess without bleeding CPT copyright 2022 [employer]. All rights reserved. The codes documented in this report are preliminary and upon coder review may  be revised to meet current compliance requirements. Maylon Peppers, MD Maylon Peppers,  02/08/2022 1:43:04 PM This report has been signed electronically. Number of Addenda: 0

## 2022-02-08 NOTE — Anesthesia Postprocedure Evaluation (Signed)
Anesthesia Post Note  Patient: Audrey Kline  Procedure(s) Performed: COLONOSCOPY WITH PROPOFOL POLYPECTOMY INTESTINAL  Patient location during evaluation: Phase II Anesthesia Type: General Level of consciousness: awake and alert and oriented Pain management: pain level controlled Vital Signs Assessment: post-procedure vital signs reviewed and stable Respiratory status: spontaneous breathing, nonlabored ventilation and respiratory function stable Cardiovascular status: blood pressure returned to baseline and stable Postop Assessment: no apparent nausea or vomiting Anesthetic complications: no  No notable events documented.   Last Vitals:  Vitals:   02/08/22 1345 02/08/22 1347  BP: (!) 95/55 (!) 107/59  Pulse: 97 78  Resp: 18 18  Temp:    SpO2: 99% 99%    Last Pain:  Vitals:   02/08/22 1345  TempSrc:   PainSc: 10-Worst pain ever                 Harvey Matlack C Analy [place]

## 2022-02-08 NOTE — Discharge Instructions (Signed)
You are being discharged to home.  Resume your previous diet.  We are waiting for your pathology results.  Your physician has recommended a repeat colonoscopy for surveillance based on pathology results.  

## 2022-02-08 NOTE — H&P (Signed)
Audrey Kline is an 66 y.o. female.   Chief Complaint: screening colonoscopy HPI: 66 y/o F with PMH COPD, HLD, OA, PTSD, sleep apnea, panic attacks, coming for history of colonic polyps.  Patient had her last colonoscopy 10, she reports she had polyps but no report available.  The patient denies having any complaints such as melena, hematochezia, abdominal pain or distention, change in her bowel movement consistency or frequency, no changes in weight recently.  No family history of colorectal cancer.  Past Medical History:  Diagnosis Date   COPD (chronic obstructive pulmonary disease) ([place])    Hypercholesterolemia 2014   Osteoarthrosis 2013   Panic attacks 2014   PTSD (post-traumatic stress disorder) 2014   Sleep apnea 2014   Spinal stenosis of lumbar region 2014    Past Surgical History:  Procedure Laterality Date   ABDOMINAL HYSTERECTOMY     CARPAL TUNNEL RELEASE Right 2015   CHOLECYSTECTOMY     [place] Left 2014   HERNIA REPAIR     SKIN BIOPSY      History reviewed. No pertinent family history. Social History:  reports that she has been smoking cigarettes. She has a 12.50 pack-year smoking history. She has never used smokeless tobacco. She reports that she does not currently use alcohol. She reports that she does not currently use drugs.  Allergies:  Allergies  Allergen Reactions   Nsaids Diarrhea   Pollen Extract Other (See Comments)    allergies   Latex Rash    Natural rubber   Naproxen Sodium Rash    Medications Prior to Admission  Medication Sig Dispense Refill   albuterol (PROVENTIL HFA;VENTOLIN HFA) 108 (90 Base) MCG/ACT inhaler Inhale 2 puffs into the lungs every 4 (four) hours as needed for wheezing or shortness of breath.     ALPRAZolam (XANAX) 0.5 MG tablet Take 0.5 mg by mouth at bedtime.     atorvastatin (LIPITOR) 20 MG tablet Take 20 mg by mouth at bedtime.     Cholecalciferol (VITAMIN D) 125 MCG (5000 UT) CAPS Take 5,000 Units by mouth daily.      cycloSPORINE (RESTASIS) 0.05 % ophthalmic emulsion Place 1 drop into both eyes 2 (two) times daily.     DULoxetine (CYMBALTA) 30 MG capsule Take 30 mg by mouth See admin instructions. Take with 60 mg for a total of 90 mg in the morning     DULoxetine (CYMBALTA) 60 MG capsule Take 60 mg by mouth See admin instructions. Take with 30 mg for a total of 90 mg     HYDROcodone-acetaminophen (NORCO) 10-325 MG tablet Take 1 tablet by mouth daily as needed for moderate pain or severe pain.     lidocaine (LIDODERM) 5 % Place 1 patch onto the skin every 12 (twelve) hours.     metoprolol succinate (TOPROL-XL) 25 MG 24 hr tablet Take 25 mg by mouth daily.     Multiple Vitamin (MULTIVITAMIN) capsule Take 1 capsule by mouth daily. 50+     Omega 3 1000 MG CAPS Take 2,000 mg by mouth daily.     omeprazole (PRILOSEC) 40 MG capsule Take 40 mg by mouth daily as needed (Heartburn).     tiotropium (SPIRIVA) 18 MCG inhalation capsule Place 2.5 mcg into inhaler and inhale daily. 2 puff      No results found for this or any previous visit (from the past 48 hour(s)). No results found.  Review of Systems  All other systems reviewed and are negative.   Blood pressure 124/74, pulse  73, temperature 98.6 F (37 C), temperature source Oral, resp. rate 16, height '5\' 2"'$  (1.575 m), weight 73.5 kg, SpO2 97 %. Physical Exam  GENERAL: The patient is AO x3, in no acute distress. HEENT: Head is normocephalic and atraumatic. EOMI are intact. Mouth is well hydrated and without lesions. NECK: Supple. No masses LUNGS: Clear to auscultation. No presence of rhonchi/wheezing/rales. Adequate chest expansion HEART: RRR, normal s1 and s2. ABDOMEN: Soft, nontender, no guarding, no peritoneal signs, and nondistended. BS +. No masses. EXTREMITIES: Without any cyanosis, clubbing, rash, lesions or edema. NEUROLOGIC: AOx3, no focal motor deficit. SKIN: no jaundice, no rashes  Assessment/Plan 66 y/o F with PMH COPD, HLD, OA, PTSD, sleep  apnea, panic attacks, coming for history of colonic polyps.  Will proceed with colonoscopy.  [name], MD 02/08/2022, 12:47 PM

## 2022-02-09 ENCOUNTER — Ambulatory Visit: Payer: Self-pay | Admitting: *Deleted

## 2022-02-09 ENCOUNTER — Encounter (INDEPENDENT_AMBULATORY_CARE_PROVIDER_SITE_OTHER): Payer: Self-pay | Admitting: *Deleted

## 2022-02-09 NOTE — Patient Outreach (Signed)
  Care Coordination   02/09/2022 Name: Audrey Kline MRN: [id number] DOB: [birth year]   Care Coordination Outreach Attempts:  An unsuccessful telephone outreach was attempted for a scheduled appointment today.  Follow Up Plan:  Additional outreach attempts will be made to offer the patient care coordination information and services.   Encounter Outcome:  No Answer   Care Coordination Interventions:  No, not indicated    Chong Sicilian, BSN, RN-BC RN Care Coordinator [place]: [phone] Main #: [phone]

## 2022-02-12 ENCOUNTER — Telehealth (INDEPENDENT_AMBULATORY_CARE_PROVIDER_SITE_OTHER): Payer: Self-pay | Admitting: *Deleted

## 2022-02-12 LAB — SURGICAL PATHOLOGY

## 2022-02-12 NOTE — Telephone Encounter (Signed)
Patient left a message that she missed Dr. Colman Cater call and wanted call back. I saw results of path and called her back to give her results. He did leave a detailed message on her vm. I tried to call to discuss resutls and had to leave her another message to return call.

## 2022-02-13 ENCOUNTER — Encounter (HOSPITAL_COMMUNITY): Payer: Self-pay | Admitting: Gastroenterology

## 2022-03-14 ENCOUNTER — Telehealth: Payer: Self-pay | Admitting: *Deleted

## 2022-03-14 NOTE — Progress Notes (Unsigned)
  Care Coordination Note  03/14/2022 Name: Audrey Kline MRN: [id number] DOB: Oct 24, 1955  Audrey Kline is a 68 y.o. year old female who is a primary care patient of Hasanaj, Samul Dada, MD and is actively engaged with the care management team. I reached out to Rockie Neighbours by phone today to assist with re-scheduling a follow up visit with the RN Case Manager  Follow up plan: Second Unsuccessful telephone outreach attempt made. A HIPAA compliant phone message was left for the patient providing contact information and requesting a return call.   [place]  Direct Dial: [phone]

## 2022-03-15 NOTE — Progress Notes (Signed)
  Care Coordination Note  03/15/2022 Name: Audrey Kline MRN: [id number] DOB: [birth year]  Audrey Kline is a 67 y.o. year old female who is a primary care patient of Hasanaj, Samul Dada, MD and is actively engaged with the care management team. I reached out to Rockie Neighbours by phone today to assist with re-scheduling a follow up visit with the RN Case Manager  Follow up plan: Unsuccessful telephone outreach attempt made. A HIPAA compliant phone message was left for the patient providing contact information and requesting a return call.  We have been unable to make contact with the patient for follow up. The care management team is available to follow up with the patient after provider conversation with the patient regarding recommendation for care management engagement and subsequent re-referral to the care management team.   [place]  Direct Dial: [phone]

## 2022-03-28 ENCOUNTER — Telehealth: Payer: Self-pay | Admitting: *Deleted

## 2022-03-28 NOTE — Progress Notes (Signed)
  Care Coordination Note  03/28/2022 Name: Sharnetta Gielow MRN: [id number] DOB: Sep 10, 1955  Donasia Wimes is a 67 y.o. year old female who is a primary care patient of Hasanaj, Samul Dada, MD and is actively engaged with the care management team. I reached out to Rockie Neighbours by phone today to assist with re-scheduling a follow up visit with the RN Case Manager  Follow up plan: Telephone appointment with care management team member scheduled for:04/03/22  [place]: [phone]

## 2022-04-03 ENCOUNTER — Ambulatory Visit: Payer: Self-pay | Admitting: *Deleted

## 2022-04-03 NOTE — Patient Outreach (Signed)
  Care Coordination   04/03/2022 Name: Audrey Kline MRN: [id number] DOB: May 22, 1955   Care Coordination Outreach Attempts:  An unsuccessful telephone outreach was attempted for a scheduled appointment today. 3 unsuccessful attempts in total.   Follow Up Plan:  No further outreach attempts will be made at this time. We have been unable to contact the patient to offer or enroll patient in care coordination services  Encounter Outcome:  No Answer. Left HIPAA compliant voicemail requesting that patient return my call at [phone] if she has any resource or care coordination needs.    Care Coordination Interventions:  No, not indicated    Chong Sicilian, BSN, RN-BC RN Care Coordinator [place] Direct Dial: [phone] Main #: [phone]

## 2022-04-10 DIAGNOSIS — Z91048 Other nonmedicinal substance allergy status: Secondary | ICD-10-CM | POA: Diagnosis not present

## 2022-04-10 DIAGNOSIS — M79604 Pain in right leg: Secondary | ICD-10-CM | POA: Diagnosis not present

## 2022-04-10 DIAGNOSIS — M79661 Pain in right lower leg: Secondary | ICD-10-CM | POA: Diagnosis not present

## 2022-04-10 DIAGNOSIS — M797 Fibromyalgia: Secondary | ICD-10-CM | POA: Diagnosis not present

## 2022-04-10 DIAGNOSIS — M79605 Pain in left leg: Secondary | ICD-10-CM | POA: Diagnosis not present

## 2022-04-10 DIAGNOSIS — M79662 Pain in left lower leg: Secondary | ICD-10-CM | POA: Diagnosis not present

## 2022-04-10 DIAGNOSIS — Z91040 Latex allergy status: Secondary | ICD-10-CM | POA: Diagnosis not present

## 2022-04-10 DIAGNOSIS — M7989 Other specified soft tissue disorders: Secondary | ICD-10-CM | POA: Diagnosis not present

## 2022-04-10 DIAGNOSIS — F17210 Nicotine dependence, cigarettes, uncomplicated: Secondary | ICD-10-CM | POA: Diagnosis not present

## 2022-04-10 DIAGNOSIS — Z886 Allergy status to analgesic agent status: Secondary | ICD-10-CM | POA: Diagnosis not present

## 2022-05-10 IMAGING — XA DG INJECT/[PERSON_NAME] INC NEEDLE/CATH/PLC EPI/CERV/THOR W/IMG
2 series · 2 of 2 positions shown · non-contrast
Comparison: none

CLINICAL DATA: Cervical radiculopathy. Bilateral neck and arm pain,
right greater than left.

[Series 1: ortho adipose · 1 of 1 slices shown (1 of 2)]
[im 1/1]
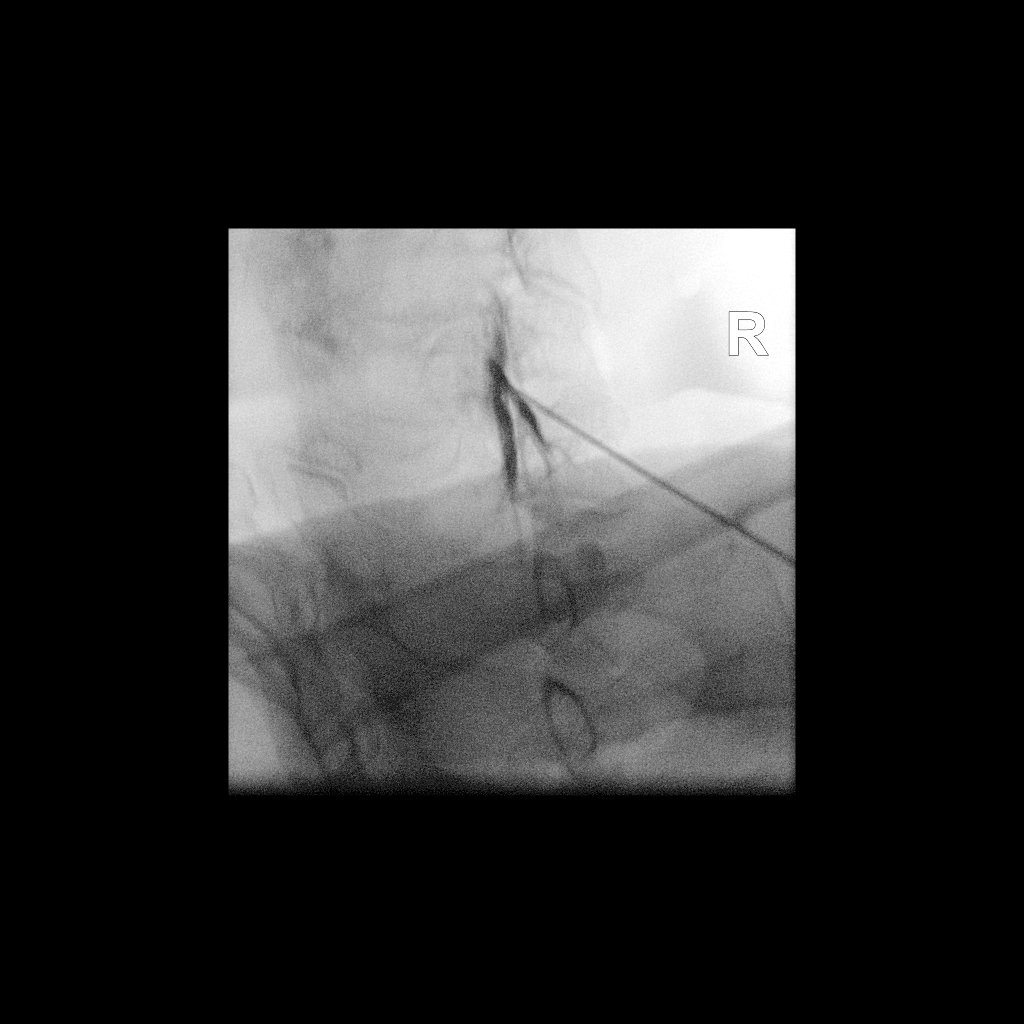

[Series 2: ortho adipose · 1 of 1 slices shown (2 of 2)]
[im 1/1]
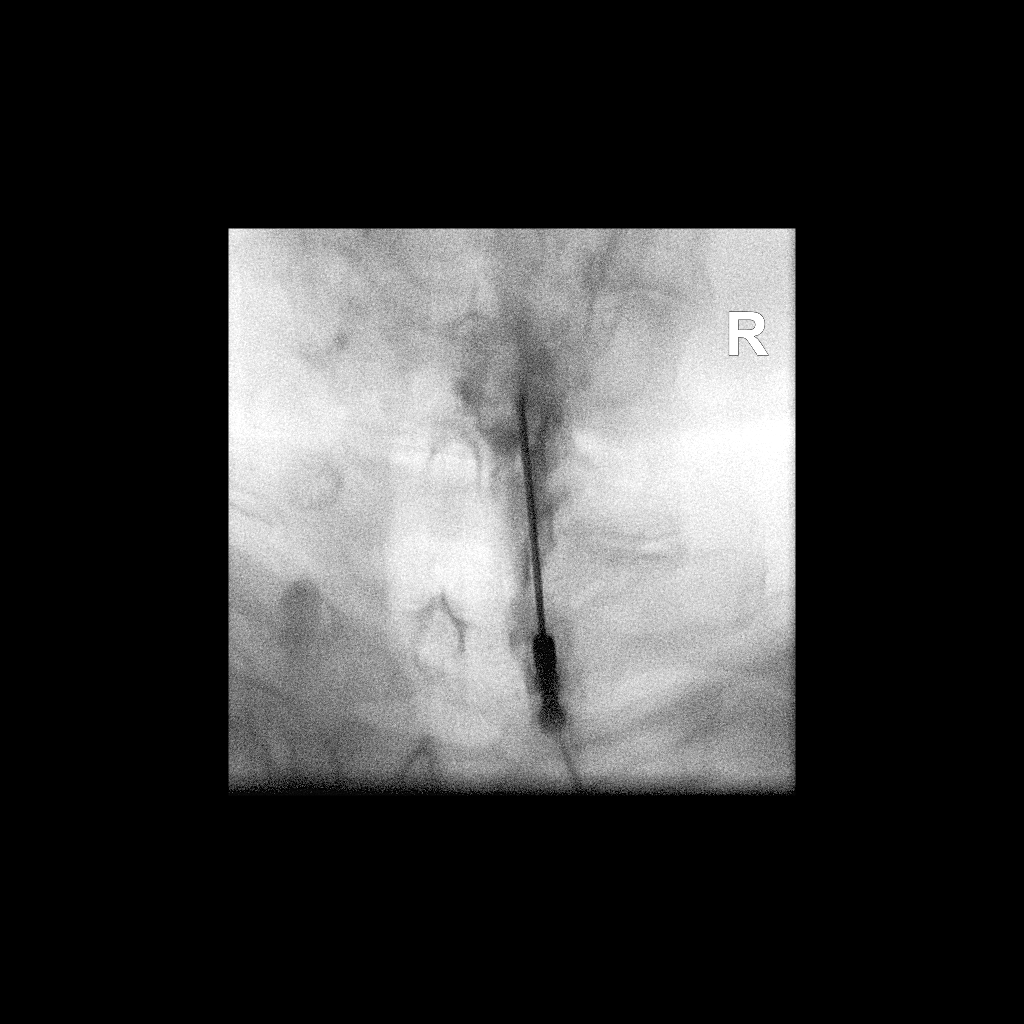

[2 of 2 positions shown; findings below may reference images not displayed]

FLUOROSCOPY TIME:  Radiation Exposure Index (as provided by the
fluoroscopic device): 15.09 uGy*m2

PROCEDURE:
CERVICAL EPIDURAL INJECTION

An interlaminar approach was performed on the right at C6-7. A 20
gauge epidural needle was advanced using loss-of-resistance
technique.

DIAGNOSTIC EPIDURAL INJECTION

Injection of Isovue-M 300 shows a good epidural pattern with spread
above and below the level of needle placement, primarily on the
right. No vascular opacification is seen. THERAPEUTIC

EPIDURAL INJECTION

1.5 ml of Kenalog 40 mixed with 1 ml of 1% Lidocaine and 2 ml of
normal saline were then instilled. The procedure was well-tolerated,
and the patient was discharged thirty minutes following the
injection in good condition.
IMPRESSION: Technically successful first epidural injection on the right at
C6-7.

## 2022-05-16 DIAGNOSIS — J449 Chronic obstructive pulmonary disease, unspecified: Secondary | ICD-10-CM | POA: Diagnosis not present

## 2022-05-16 DIAGNOSIS — I2510 Atherosclerotic heart disease of native coronary artery without angina pectoris: Secondary | ICD-10-CM | POA: Diagnosis not present

## 2022-05-16 DIAGNOSIS — I700 Atherosclerosis of aorta: Secondary | ICD-10-CM | POA: Diagnosis not present

## 2022-05-16 DIAGNOSIS — M25561 Pain in right knee: Secondary | ICD-10-CM | POA: Diagnosis not present

## 2022-05-16 DIAGNOSIS — E785 Hyperlipidemia, unspecified: Secondary | ICD-10-CM | POA: Diagnosis not present

## 2022-05-16 DIAGNOSIS — M10029 Idiopathic gout, unspecified elbow: Secondary | ICD-10-CM | POA: Diagnosis not present

## 2022-05-16 DIAGNOSIS — Z0000 Encounter for general adult medical examination without abnormal findings: Secondary | ICD-10-CM | POA: Diagnosis not present

## 2022-05-16 DIAGNOSIS — I10 Essential (primary) hypertension: Secondary | ICD-10-CM | POA: Diagnosis not present

## 2022-05-16 DIAGNOSIS — M150 Primary generalized (osteo)arthritis: Secondary | ICD-10-CM | POA: Diagnosis not present

## 2022-05-23 DIAGNOSIS — I672 Cerebral atherosclerosis: Secondary | ICD-10-CM | POA: Diagnosis not present

## 2022-05-23 DIAGNOSIS — I700 Atherosclerosis of aorta: Secondary | ICD-10-CM | POA: Diagnosis not present

## 2022-05-23 DIAGNOSIS — I6523 Occlusion and stenosis of bilateral carotid arteries: Secondary | ICD-10-CM | POA: Diagnosis not present

## 2022-05-23 DIAGNOSIS — G43919 Migraine, unspecified, intractable, without status migrainosus: Secondary | ICD-10-CM | POA: Diagnosis not present

## 2022-05-23 DIAGNOSIS — E785 Hyperlipidemia, unspecified: Secondary | ICD-10-CM | POA: Diagnosis not present

## 2022-05-23 DIAGNOSIS — J449 Chronic obstructive pulmonary disease, unspecified: Secondary | ICD-10-CM | POA: Diagnosis not present

## 2022-05-23 DIAGNOSIS — Z743 Need for continuous supervision: Secondary | ICD-10-CM | POA: Diagnosis not present

## 2022-05-23 DIAGNOSIS — Z79899 Other long term (current) drug therapy: Secondary | ICD-10-CM | POA: Diagnosis not present

## 2022-05-23 DIAGNOSIS — I10 Essential (primary) hypertension: Secondary | ICD-10-CM | POA: Diagnosis not present

## 2022-05-23 DIAGNOSIS — F17210 Nicotine dependence, cigarettes, uncomplicated: Secondary | ICD-10-CM | POA: Diagnosis not present

## 2022-05-23 DIAGNOSIS — R200 Anesthesia of skin: Secondary | ICD-10-CM | POA: Diagnosis not present

## 2022-05-23 DIAGNOSIS — G4489 Other headache syndrome: Secondary | ICD-10-CM | POA: Diagnosis not present

## 2022-05-23 DIAGNOSIS — J341 Cyst and mucocele of nose and nasal sinus: Secondary | ICD-10-CM | POA: Diagnosis not present

## 2022-05-23 DIAGNOSIS — G43909 Migraine, unspecified, not intractable, without status migrainosus: Secondary | ICD-10-CM | POA: Diagnosis not present

## 2022-05-23 DIAGNOSIS — R1111 Vomiting without nausea: Secondary | ICD-10-CM | POA: Diagnosis not present

## 2022-05-23 DIAGNOSIS — I499 Cardiac arrhythmia, unspecified: Secondary | ICD-10-CM | POA: Diagnosis not present

## 2022-05-23 DIAGNOSIS — I6601 Occlusion and stenosis of right middle cerebral artery: Secondary | ICD-10-CM | POA: Diagnosis not present

## 2022-07-24 DIAGNOSIS — M25512 Pain in left shoulder: Secondary | ICD-10-CM | POA: Diagnosis not present

## 2022-07-24 DIAGNOSIS — M542 Cervicalgia: Secondary | ICD-10-CM | POA: Diagnosis not present

## 2022-07-24 DIAGNOSIS — M25511 Pain in right shoulder: Secondary | ICD-10-CM | POA: Diagnosis not present

## 2022-07-24 DIAGNOSIS — Z79899 Other long term (current) drug therapy: Secondary | ICD-10-CM | POA: Diagnosis not present

## 2022-07-24 DIAGNOSIS — M546 Pain in thoracic spine: Secondary | ICD-10-CM | POA: Diagnosis not present

## 2022-07-24 DIAGNOSIS — Z79891 Long term (current) use of opiate analgesic: Secondary | ICD-10-CM | POA: Diagnosis not present

## 2022-07-24 DIAGNOSIS — M549 Dorsalgia, unspecified: Secondary | ICD-10-CM | POA: Diagnosis not present

## 2022-07-24 DIAGNOSIS — M19042 Primary osteoarthritis, left hand: Secondary | ICD-10-CM | POA: Diagnosis not present

## 2022-07-24 DIAGNOSIS — M797 Fibromyalgia: Secondary | ICD-10-CM | POA: Diagnosis not present

## 2022-07-24 DIAGNOSIS — G894 Chronic pain syndrome: Secondary | ICD-10-CM | POA: Diagnosis not present

## 2022-07-30 DIAGNOSIS — Z91048 Other nonmedicinal substance allergy status: Secondary | ICD-10-CM | POA: Diagnosis not present

## 2022-07-30 DIAGNOSIS — Z886 Allergy status to analgesic agent status: Secondary | ICD-10-CM | POA: Diagnosis not present

## 2022-07-30 DIAGNOSIS — R519 Headache, unspecified: Secondary | ICD-10-CM | POA: Diagnosis not present

## 2022-07-30 DIAGNOSIS — M545 Low back pain, unspecified: Secondary | ICD-10-CM | POA: Diagnosis not present

## 2022-07-30 DIAGNOSIS — Z91040 Latex allergy status: Secondary | ICD-10-CM | POA: Diagnosis not present

## 2022-07-30 DIAGNOSIS — I2510 Atherosclerotic heart disease of native coronary artery without angina pectoris: Secondary | ICD-10-CM | POA: Diagnosis not present

## 2022-07-30 DIAGNOSIS — Z79899 Other long term (current) drug therapy: Secondary | ICD-10-CM | POA: Diagnosis not present

## 2022-07-30 DIAGNOSIS — E785 Hyperlipidemia, unspecified: Secondary | ICD-10-CM | POA: Diagnosis not present

## 2022-07-30 DIAGNOSIS — F17210 Nicotine dependence, cigarettes, uncomplicated: Secondary | ICD-10-CM | POA: Diagnosis not present

## 2022-07-30 DIAGNOSIS — I10 Essential (primary) hypertension: Secondary | ICD-10-CM | POA: Diagnosis not present

## 2022-07-30 DIAGNOSIS — M546 Pain in thoracic spine: Secondary | ICD-10-CM | POA: Diagnosis not present

## 2022-07-30 DIAGNOSIS — J449 Chronic obstructive pulmonary disease, unspecified: Secondary | ICD-10-CM | POA: Diagnosis not present

## 2022-09-10 DIAGNOSIS — M25511 Pain in right shoulder: Secondary | ICD-10-CM | POA: Diagnosis not present

## 2022-09-10 DIAGNOSIS — Z79899 Other long term (current) drug therapy: Secondary | ICD-10-CM | POA: Diagnosis not present

## 2022-09-10 DIAGNOSIS — M797 Fibromyalgia: Secondary | ICD-10-CM | POA: Diagnosis not present

## 2022-09-10 DIAGNOSIS — M25512 Pain in left shoulder: Secondary | ICD-10-CM | POA: Diagnosis not present

## 2022-09-10 DIAGNOSIS — M19042 Primary osteoarthritis, left hand: Secondary | ICD-10-CM | POA: Diagnosis not present

## 2022-09-10 DIAGNOSIS — G894 Chronic pain syndrome: Secondary | ICD-10-CM | POA: Diagnosis not present

## 2022-09-10 DIAGNOSIS — M549 Dorsalgia, unspecified: Secondary | ICD-10-CM | POA: Diagnosis not present

## 2022-09-10 DIAGNOSIS — M546 Pain in thoracic spine: Secondary | ICD-10-CM | POA: Diagnosis not present

## 2022-09-10 DIAGNOSIS — M542 Cervicalgia: Secondary | ICD-10-CM | POA: Diagnosis not present

## 2022-09-10 DIAGNOSIS — Z79891 Long term (current) use of opiate analgesic: Secondary | ICD-10-CM | POA: Diagnosis not present

## 2022-10-08 DIAGNOSIS — M546 Pain in thoracic spine: Secondary | ICD-10-CM | POA: Diagnosis not present

## 2022-10-08 DIAGNOSIS — M25511 Pain in right shoulder: Secondary | ICD-10-CM | POA: Diagnosis not present

## 2022-10-08 DIAGNOSIS — M25512 Pain in left shoulder: Secondary | ICD-10-CM | POA: Diagnosis not present

## 2022-10-08 DIAGNOSIS — G894 Chronic pain syndrome: Secondary | ICD-10-CM | POA: Diagnosis not present

## 2022-10-08 DIAGNOSIS — M549 Dorsalgia, unspecified: Secondary | ICD-10-CM | POA: Diagnosis not present

## 2022-10-08 DIAGNOSIS — M797 Fibromyalgia: Secondary | ICD-10-CM | POA: Diagnosis not present

## 2022-10-08 DIAGNOSIS — M542 Cervicalgia: Secondary | ICD-10-CM | POA: Diagnosis not present

## 2022-10-08 DIAGNOSIS — M19042 Primary osteoarthritis, left hand: Secondary | ICD-10-CM | POA: Diagnosis not present

## 2022-10-15 DIAGNOSIS — M25561 Pain in right knee: Secondary | ICD-10-CM | POA: Diagnosis not present

## 2022-10-15 DIAGNOSIS — I10 Essential (primary) hypertension: Secondary | ICD-10-CM | POA: Diagnosis not present

## 2022-10-15 DIAGNOSIS — M150 Primary generalized (osteo)arthritis: Secondary | ICD-10-CM | POA: Diagnosis not present

## 2022-10-15 DIAGNOSIS — Z0000 Encounter for general adult medical examination without abnormal findings: Secondary | ICD-10-CM | POA: Diagnosis not present

## 2022-10-15 DIAGNOSIS — I700 Atherosclerosis of aorta: Secondary | ICD-10-CM | POA: Diagnosis not present

## 2022-10-15 DIAGNOSIS — M10029 Idiopathic gout, unspecified elbow: Secondary | ICD-10-CM | POA: Diagnosis not present

## 2022-10-15 DIAGNOSIS — I2510 Atherosclerotic heart disease of native coronary artery without angina pectoris: Secondary | ICD-10-CM | POA: Diagnosis not present

## 2022-10-15 DIAGNOSIS — J449 Chronic obstructive pulmonary disease, unspecified: Secondary | ICD-10-CM | POA: Diagnosis not present

## 2022-10-15 DIAGNOSIS — E785 Hyperlipidemia, unspecified: Secondary | ICD-10-CM | POA: Diagnosis not present

## 2022-11-20 DIAGNOSIS — M25512 Pain in left shoulder: Secondary | ICD-10-CM | POA: Diagnosis not present

## 2022-11-20 DIAGNOSIS — M797 Fibromyalgia: Secondary | ICD-10-CM | POA: Diagnosis not present

## 2022-11-20 DIAGNOSIS — M542 Cervicalgia: Secondary | ICD-10-CM | POA: Diagnosis not present

## 2022-11-20 DIAGNOSIS — G894 Chronic pain syndrome: Secondary | ICD-10-CM | POA: Diagnosis not present

## 2022-11-20 DIAGNOSIS — Z79899 Other long term (current) drug therapy: Secondary | ICD-10-CM | POA: Diagnosis not present

## 2022-11-20 DIAGNOSIS — M546 Pain in thoracic spine: Secondary | ICD-10-CM | POA: Diagnosis not present

## 2022-11-20 DIAGNOSIS — M19042 Primary osteoarthritis, left hand: Secondary | ICD-10-CM | POA: Diagnosis not present

## 2022-11-20 DIAGNOSIS — M25511 Pain in right shoulder: Secondary | ICD-10-CM | POA: Diagnosis not present

## 2022-11-20 DIAGNOSIS — M549 Dorsalgia, unspecified: Secondary | ICD-10-CM | POA: Diagnosis not present

## 2022-11-20 DIAGNOSIS — Z79891 Long term (current) use of opiate analgesic: Secondary | ICD-10-CM | POA: Diagnosis not present

## 2022-12-12 DIAGNOSIS — Z79899 Other long term (current) drug therapy: Secondary | ICD-10-CM | POA: Diagnosis not present

## 2022-12-12 DIAGNOSIS — J449 Chronic obstructive pulmonary disease, unspecified: Secondary | ICD-10-CM | POA: Diagnosis not present

## 2022-12-12 DIAGNOSIS — Z9049 Acquired absence of other specified parts of digestive tract: Secondary | ICD-10-CM | POA: Diagnosis not present

## 2022-12-12 DIAGNOSIS — Z886 Allergy status to analgesic agent status: Secondary | ICD-10-CM | POA: Diagnosis not present

## 2022-12-12 DIAGNOSIS — I10 Essential (primary) hypertension: Secondary | ICD-10-CM | POA: Diagnosis not present

## 2022-12-12 DIAGNOSIS — R109 Unspecified abdominal pain: Secondary | ICD-10-CM | POA: Diagnosis not present

## 2022-12-12 DIAGNOSIS — E785 Hyperlipidemia, unspecified: Secondary | ICD-10-CM | POA: Diagnosis not present

## 2022-12-12 DIAGNOSIS — F17210 Nicotine dependence, cigarettes, uncomplicated: Secondary | ICD-10-CM | POA: Diagnosis not present

## 2022-12-12 DIAGNOSIS — R1084 Generalized abdominal pain: Secondary | ICD-10-CM | POA: Diagnosis not present

## 2022-12-12 DIAGNOSIS — Z91040 Latex allergy status: Secondary | ICD-10-CM | POA: Diagnosis not present

## 2022-12-12 DIAGNOSIS — Z743 Need for continuous supervision: Secondary | ICD-10-CM | POA: Diagnosis not present

## 2022-12-20 ENCOUNTER — Other Ambulatory Visit: Payer: Self-pay

## 2022-12-20 ENCOUNTER — Emergency Department (HOSPITAL_COMMUNITY): Payer: 59

## 2022-12-20 ENCOUNTER — Emergency Department (HOSPITAL_COMMUNITY)
Admission: EM | Admit: 2022-12-20 | Discharge: 2022-12-20 | Disposition: A | Discharge status: Home / Self Care | Payer: 59 | Attending: Emergency Medicine | Admitting: Emergency Medicine

## 2022-12-20 ENCOUNTER — Ambulatory Visit (INDEPENDENT_AMBULATORY_CARE_PROVIDER_SITE_OTHER): Payer: 59 | Admitting: Gastroenterology

## 2022-12-20 ENCOUNTER — Encounter (HOSPITAL_COMMUNITY): Payer: Self-pay

## 2022-12-20 ENCOUNTER — Encounter (INDEPENDENT_AMBULATORY_CARE_PROVIDER_SITE_OTHER): Payer: Self-pay | Admitting: Gastroenterology

## 2022-12-20 VITALS — BP 132/83 | HR 92 | Temp 97.1°F | Ht 63.0 in

## 2022-12-20 DIAGNOSIS — R10819 Abdominal tenderness, unspecified site: Secondary | ICD-10-CM | POA: Diagnosis not present

## 2022-12-20 DIAGNOSIS — K5900 Constipation, unspecified: Secondary | ICD-10-CM | POA: Diagnosis not present

## 2022-12-20 DIAGNOSIS — K529 Noninfective gastroenteritis and colitis, unspecified: Secondary | ICD-10-CM | POA: Insufficient documentation

## 2022-12-20 DIAGNOSIS — J449 Chronic obstructive pulmonary disease, unspecified: Secondary | ICD-10-CM | POA: Insufficient documentation

## 2022-12-20 DIAGNOSIS — I700 Atherosclerosis of aorta: Secondary | ICD-10-CM | POA: Diagnosis not present

## 2022-12-20 DIAGNOSIS — R1030 Lower abdominal pain, unspecified: Secondary | ICD-10-CM | POA: Diagnosis not present

## 2022-12-20 DIAGNOSIS — Q600 Renal agenesis, unilateral: Secondary | ICD-10-CM | POA: Diagnosis not present

## 2022-12-20 DIAGNOSIS — R1084 Generalized abdominal pain: Secondary | ICD-10-CM | POA: Diagnosis present

## 2022-12-20 DIAGNOSIS — R109 Unspecified abdominal pain: Secondary | ICD-10-CM | POA: Diagnosis not present

## 2022-12-20 DIAGNOSIS — Z91040 Latex allergy status: Secondary | ICD-10-CM | POA: Insufficient documentation

## 2022-12-20 LAB — COMPREHENSIVE METABOLIC PANEL
ALT: 12 U/L (ref 0–44)
AST: 17 U/L (ref 15–41)
Albumin: 4.1 g/dL (ref 3.5–5.0)
Alkaline Phosphatase: 72 U/L (ref 38–126)
Anion gap: 5 (ref 5–15)
BUN: 12 mg/dL (ref 8–23)
CO2: 27 mmol/L (ref 22–32)
Calcium: 9.5 mg/dL (ref 8.9–10.3)
Chloride: 103 mmol/L (ref 98–111)
Creatinine, Ser: 0.58 mg/dL (ref 0.44–1.00)
GFR, Estimated: >60 mL/min (ref >60)
Glucose, Bld: 98 mg/dL (ref 70–99)
Potassium: 4.1 mmol/L (ref 3.5–5.1)
Sodium: 135 mmol/L (ref 135–145)
Total Bilirubin: 0.7 mg/dL (ref 0.3–1.2)
Total Protein: 6.9 g/dL (ref 6.5–8.1)

## 2022-12-20 LAB — LIPASE, BLOOD: Lipase: 28 U/L (ref 11–51)

## 2022-12-20 LAB — CBC
HCT: 39.9 % (ref 36.0–46.0)
Hemoglobin: 13.1 g/dL (ref 12.0–15.0)
MCH: 31.8 pg (ref 26.0–34.0)
MCHC: 32.8 g/dL (ref 30.0–36.0)
MCV: 96.8 fL (ref 80.0–100.0)
Platelets: 258 10*3/uL (ref 150–400)
RBC: 4.12 MIL/uL (ref 3.87–5.11)
RDW: 12.8 % (ref 11.5–15.5)
WBC: 8.5 10*3/uL (ref 4.0–10.5)
nRBC: 0 % (ref 0.0–0.2)

## 2022-12-20 LAB — URINALYSIS, ROUTINE W REFLEX MICROSCOPIC
Bilirubin Urine: NEGATIVE
Glucose, UA: NEGATIVE mg/dL
Ketones, ur: NEGATIVE mg/dL
Nitrite: NEGATIVE
Protein, ur: NEGATIVE mg/dL
Specific Gravity, Urine: 1.012 (ref 1.005–1.030)
pH: 5 (ref 5.0–8.0)

## 2022-12-20 MED ORDER — FENTANYL CITRATE PF 50 MCG/ML IJ SOSY
50.0000 ug | PREFILLED_SYRINGE | Freq: Once | INTRAMUSCULAR | Status: AC
  Administered 2022-12-20: 50 ug via INTRAVENOUS
  Filled 2022-12-20: qty 1

## 2022-12-20 MED ORDER — POLYETHYLENE GLYCOL 3350 17 GM/SCOOP PO POWD: 17.0000 g | Freq: Every day | ORAL | 0 refills | Status: DC

## 2022-12-20 MED ORDER — ONDANSETRON HCL 4 MG/2ML IJ SOLN
4.0000 mg | Freq: Once | INTRAMUSCULAR | Status: AC
  Administered 2022-12-20: 4 mg via INTRAVENOUS
  Filled 2022-12-20: qty 2

## 2022-12-20 MED ORDER — ONDANSETRON HCL 4 MG PO TABS: 4.0000 mg | ORAL_TABLET | Freq: Three times a day (TID) | ORAL | 0 refills | Status: DC | PRN

## 2022-12-20 MED ORDER — AMOXICILLIN-POT CLAVULANATE 875-125 MG PO TABS: 1.0000 | ORAL_TABLET | Freq: Two times a day (BID) | ORAL | 0 refills | Status: DC

## 2022-12-20 MED ORDER — AMOXICILLIN-POT CLAVULANATE 875-125 MG PO TABS
1.0000 | ORAL_TABLET | Freq: Once | ORAL | Status: AC
  Administered 2022-12-20: 1 via ORAL
  Filled 2022-12-20: qty 1

## 2022-12-20 MED ORDER — IOHEXOL 350 MG/ML SOLN
100.0000 mL | Freq: Once | INTRAVENOUS | Status: AC | PRN
  Administered 2022-12-20: 100 mL via INTRAVENOUS

## 2022-12-20 MED ORDER — MORPHINE SULFATE (PF) 4 MG/ML IV SOLN
4.0000 mg | Freq: Once | INTRAVENOUS | Status: AC
  Administered 2022-12-20: 4 mg via INTRAVENOUS
  Filled 2022-12-20: qty 1

## 2022-12-20 NOTE — ED Notes (Addendum)
Arrives from [facility] to room 18, pending urine results, and CTA abd/pelvis.

## 2022-12-20 NOTE — Progress Notes (Signed)
Katrinka Blazing, M.D. Gastroenterology & Hepatology [facility] Gastroenterology [address] [place], [place] [address]  Primary Care Physician: Toma Deiters, MD [address] [place] [address]  I will communicate my assessment and recommendations to the referring MD via EMR.  Problems: Subacute abdominal pain  History of Present Illness: Audrey Kline is a 67 y.o. female with past medical history of COPD, hyperlipidemia, panic attacks, PTSD, sleep apnea, spinal stenosis, who presents for evaluation of abdominal pain and constipation.  The patient was last seen on 02/08/2022 for her screening colonoscopy.  Patient reports around August 2024 she reported having very small caliber feces described as ribbon and having worsening abdominal distention. Since then her Bms were flipping between diarrhea and constipation.  Patient reports that for the last 2 weeks she has presented worsening severe abdominal pain. States the pain started in the RUQ, but now is generalized. She reports that she has severe pain when she eats something solid. Was having significant bloating as well. She reports having severe constipation  for almost a week and a half. She has had frequent nausea but no vomiting. Appetite is decreased.  Patient went to the ED at [facility] on 12/12/2022.  Upon review of labs, she had a normal CBC, CMP, and urinalysis.  A CT abdomen and pelvis with IV contrast was read as being completely normal. Has been taking Miralax since then as advised, which has led to defecation of a small amount of defecation but reports the pain is even worse when she defecates - described the pain as stabbing pain. She has not changed her diet recently and eats plenty of vegetables.  States her stools have become paler in color.  Notably, the patient is taking chronically Hycet once a day, as she wants toa void constipation.  The patient denies having any fever, chills,  hematochezia, melena, hematemesis, diarrhea, jaundice, pruritus or weight loss.  Last EGD: never Last Colonoscopy: 02/08/2022 - Three 3 to 6 mm polyps in the transverse colon and in the cecum, removed with a cold snare. Resected and retrieved. - Three 2 to 8 mm polyps in the rectum and in the descending colon, removed with a cold snare. Resected and retrieved. - Diverticulosis in the sigmoid colon. - The distal rectum and anal verge are normal on retroflexion view.  Pathology showed one colonic tubular adenoma and 2 SSLs, repeat colonoscopy in 5 years   Past Medical History: Past Medical History:  Diagnosis Date   COPD (chronic obstructive pulmonary disease) (HCC)    Hypercholesterolemia 2014   Osteoarthrosis 2013   Panic attacks 2014   PTSD (post-traumatic stress disorder) 2014   Sleep apnea 2014   Spinal stenosis of lumbar region 2014    Past Surgical History: Past Surgical History:  Procedure Laterality Date   ABDOMINAL HYSTERECTOMY     CARPAL TUNNEL RELEASE Right 2015   CHOLECYSTECTOMY     COLONOSCOPY WITH PROPOFOL N/A 02/08/2022   Procedure: COLONOSCOPY WITH PROPOFOL;  Surgeon: Dolores Frame, MD;  Location: [facility];  Service: Gastroenterology;  Laterality: N/A;  1:45pm, asa 2   [place] Left 2014   HERNIA REPAIR     POLYPECTOMY  02/08/2022   Procedure: POLYPECTOMY INTESTINAL;  Surgeon: Dolores Frame, MD;  Location: [facility];  Service: Gastroenterology;;   SKIN BIOPSY      Family History:History reviewed. No pertinent family history.  Social History: Social History   Tobacco Use  Smoking Status Every Day  Current packs/day: 0.50   Average packs/day: 0.5 packs/day for 25.0 years (12.5 ttl pk-yrs)   Types: Cigarettes  Smokeless Tobacco Never   Social History   Substance and Sexual Activity  Alcohol Use Not Currently   Social History   Substance and Sexual Activity  Drug Use Not Currently    Allergies: Allergies   Allergen Reactions   Nsaids Diarrhea   Pollen Extract Other (See Comments)    allergies   Latex Rash    Natural rubber   Naproxen Sodium Rash    Medications: Current Outpatient Medications  Medication Sig Dispense Refill   albuterol (PROVENTIL HFA;VENTOLIN HFA) 108 (90 Base) MCG/ACT inhaler Inhale 2 puffs into the lungs every 4 (four) hours as needed for wheezing or shortness of breath.     ALPRAZolam (XANAX) 0.5 MG tablet Take 0.5 mg by mouth at bedtime.     cycloSPORINE (RESTASIS) 0.05 % ophthalmic emulsion Place 1 drop into both eyes 2 (two) times daily.     HYDROcodone-acetaminophen (HYCET) 7.5-325 mg/15 ml solution Take 1 tablet by mouth every 6 (six) hours as needed for moderate pain or severe pain.     metoprolol succinate (TOPROL-XL) 25 MG 24 hr tablet Take 25 mg by mouth daily.     omeprazole (PRILOSEC) 40 MG capsule Take 40 mg by mouth daily as needed (Heartburn).     tiotropium (SPIRIVA) 18 MCG inhalation capsule Place 2.5 mcg into inhaler and inhale daily. 2 puff     No current facility-administered medications for this visit.    Review of Systems: GENERAL: negative for malaise, night sweats HEENT: No changes in hearing or vision, no nose bleeds or other nasal problems. NECK: Negative for lumps, goiter, pain and significant neck swelling RESPIRATORY: Negative for cough, wheezing CARDIOVASCULAR: Negative for chest pain, leg swelling, palpitations, orthopnea GI: SEE HPI MUSCULOSKELETAL: Negative for joint pain or swelling, back pain, and muscle pain. SKIN: Negative for lesions, rash PSYCH: Negative for sleep disturbance, mood disorder and recent psychosocial stressors. HEMATOLOGY Negative for prolonged bleeding, bruising easily, and swollen nodes. ENDOCRINE: Negative for cold or heat intolerance, polyuria, polydipsia and goiter. NEURO: negative for tremor, gait imbalance, syncope and seizures. The remainder of the review of systems is noncontributory.   Physical  Exam: BP 132/83 (BP Location: Left Arm, Patient Position: Sitting, Cuff Size: Normal)   Pulse 92   Temp (!) 97.1 F (36.2 C) (Temporal)   Ht 5\' 3"  (1.6 m)   BMI 28.70 kg/m  GENERAL: The patient is AO x3, in no acute distress. Obese. HEENT: Head is normocephalic and atraumatic. EOMI are intact. Mouth is well hydrated and without lesions. NECK: Supple. No masses LUNGS: Clear to auscultation. No presence of rhonchi/wheezing/rales. Adequate chest expansion HEART: RRR, normal s1 and s2. ABDOMEN: Severely tender to palpation diffusely but worse in the lower abdomen, had voluntary guarding, no peritoneal signs, and nondistended. BS +. No masses. EXTREMITIES: Without any cyanosis, clubbing, rash, lesions or edema. NEUROLOGIC: AOx3, no focal motor deficit. SKIN: no jaundice, no rashes  Imaging/Labs: as above  I personally reviewed and interpreted the available labs, imaging and endoscopic files.  Impression and Plan: Audrey Kline is a 67 y.o. female with past medical history of COPD, hyperlipidemia, panic attacks, PTSD, sleep apnea, spinal stenosis, who presents for evaluation of abdominal pain and constipation.  The patient has presented subacute onset of progressive worsening abdominal pain.  She had an evaluation at [facility] that was unremarkable including cross-sectional abdominal imaging.  Unfortunately I am not  able to review these images as this is a different facility.  The patient is presenting severe pain and has tenderness throughout her abdomen.  The patient is tearful and very symptomatic.  I advised her to be seen at [facility] ER for further evaluation, may require a CT angio of the abdomen and pelvis with IV contrast to rule out acute abnormalities of her abdomen.  It is possible that she may have some underlying bowel hypersensitivity given her chronic use of opiates but this should not explain alone the severity of her current pain.  I called the ER and spoke with  Dr. Posey Rea about her case.  She should increase the amount of MiraLAX she is taking when she leaves the hospital.  -Patient was advised to go to the ER to have further evaluation, may consider proceeding with a CT angio of the abdomen and pelvis with IV contrast -Continue MiraLAX daily.  Can increase it to 3 capfuls per day to achieve more regular bowel movements.  All questions were answered.      Katrinka Blazing, MD Gastroenterology and Hepatology [facility] Gastroenterology

## 2022-12-20 NOTE — ED Provider Notes (Signed)
[place] EMERGENCY DEPARTMENT AT [facility] Provider Note   CSN: [id number] Arrival date & time: 12/20/22  1441     History  Chief Complaint  Patient presents with   Abdominal Pain    Audrey Kline is a 67 y.o. female.  The history is provided by the patient and medical records. No language interpreter was used.  Abdominal Pain    67 year old female with history of COPD, hyperlipidemia, PTSD, panic attack, spinal stenosis, sleep apnea, sent here from GI office for evaluation of abdominal pain and constipation.  Patient Dors for the past 2 weeks she has had progressive worsening diffuse abdominal pain.  Along with the pain she also endorsed feeling nauseous, occasional vomiting and having constipation.  Pain is moderate to severe and she also endorsed having chills.  She does not endorse any fever chest pain shortness of breath productive cough or urinary symptoms.  She mentions she was seen at an outside hospital recently for her symptoms.  States she had a CT scan done and it was unremarkable.  She was prescribed MiraLAX and she did took it for about a week with some improvement of her symptoms however after she stopped taking MiraLAX, constipation returns.  She feels like her abdomen is distended.  She does take chronic opiate use medication but that is not new.  She was seen at the GI office today and sent here for further workup with instruction to obtain abdominal pelvis CT scan with angiogram.  Home Medications Prior to Admission medications   Medication Sig Start Date End Date Taking? Authorizing Provider  albuterol (PROVENTIL HFA;VENTOLIN HFA) 108 (90 Base) MCG/ACT inhaler Inhale 2 puffs into the lungs every 4 (four) hours as needed for wheezing or shortness of breath.    [provider]  ALPRAZolam Prudy Feeler) 0.5 MG tablet Take 0.5 mg by mouth at bedtime.    [provider]  buprenorphine (BUTRANS) 5 MCG/HR PTWK Place 1 patch onto the skin once a  week. 07/27/22   [provider]  cycloSPORINE (RESTASIS) 0.05 % ophthalmic emulsion Place 1 drop into both eyes 2 (two) times daily.    [provider]  HYDROcodone-acetaminophen (HYCET) 7.5-325 mg/15 ml solution Take 1 tablet by mouth every 6 (six) hours as needed for moderate pain or severe pain.    [provider]  metoprolol succinate (TOPROL-XL) 25 MG 24 hr tablet Take 25 mg by mouth daily.    [provider]  naloxone [facility]) nasal spray 4 mg/0.1 mL Place 1 spray into the nose once. 07/24/22   [provider]  omeprazole (PRILOSEC) 40 MG capsule Take 40 mg by mouth daily as needed (Heartburn).    [provider]  ondansetron (ZOFRAN-ODT) 4 MG disintegrating tablet Take 4 mg by mouth every 8 (eight) hours as needed for nausea or vomiting. 07/31/22   [provider]  SUMAtriptan (IMITREX) 50 MG tablet Take 50 mg by mouth every 2 (two) hours as needed for migraine. 07/30/22 07/30/23  [provider]  tiotropium (SPIRIVA) 18 MCG inhalation capsule Place 2.5 mcg into inhaler and inhale daily. 2 puff    [provider]      Allergies    Nsaids, Pollen extract, Latex, and Naproxen sodium    Review of Systems   Review of Systems  Gastrointestinal:  Positive for abdominal pain.  All other systems reviewed and are negative.   Physical Exam Updated Vital Signs BP (!) 153/73   Pulse 81   Temp 99  F (37.2 C) (Oral)   Resp (!) 22   Ht 5\' 3"  (1.6 m)   Wt 84.4 kg   SpO2 97%   BMI 32.97 kg/m  Physical Exam Vitals and nursing note reviewed.  Constitutional:      General: She is not in acute distress.    Appearance: She is well-developed.     Comments: Patient appears uncomfortable, moaning  HENT:     Head: Atraumatic.  Eyes:     Conjunctiva/sclera: Conjunctivae normal.  Cardiovascular:     Rate and Rhythm: Normal rate and regular rhythm.  Pulmonary:     Effort: Pulmonary effort is normal.  Abdominal:      General: Bowel sounds are normal. There is distension.     Tenderness: There is abdominal tenderness.     Hernia: No hernia is present.  Musculoskeletal:     Cervical back: Neck supple.  Skin:    Findings: No rash.  Neurological:     Mental Status: She is alert.  Psychiatric:        Mood and Affect: Mood normal.     ED Results / Procedures / Treatments   Labs (all labs ordered are listed, but only abnormal results are displayed) Labs Reviewed  URINALYSIS, ROUTINE W REFLEX MICROSCOPIC - Abnormal; Notable for the following components:      Result Value   Hgb urine dipstick SMALL (*)    Leukocytes,Ua TRACE (*)    Bacteria, UA RARE (*)    All other components within normal limits  LIPASE, BLOOD  COMPREHENSIVE METABOLIC PANEL  CBC    EKG None  Radiology CT Angio Abd/Pel W and/or Wo Contrast  Result Date: 12/20/2022 CLINICAL DATA:  Acute mesenteric ischemia, lower abdominal pain EXAM: CTA ABDOMEN AND PELVIS WITHOUT AND WITH CONTRAST TECHNIQUE: Multidetector CT imaging of the abdomen and pelvis was performed using the standard protocol during bolus administration of intravenous contrast. Multiplanar reconstructed images and MIPs were obtained and reviewed to evaluate the vascular anatomy. RADIATION DOSE REDUCTION: This exam was performed according to the departmental dose-optimization program which includes automated exposure control, adjustment of the mA and/or kV according to patient size and/or use of iterative reconstruction technique. CONTRAST:  OMNIPAQUE IOHEXOL 350 MG/ML SOLN COMPARISON:  None Available. FINDINGS: VASCULAR Aorta: Normal caliber. No aneurysm or dissection. No periaortic inflammatory change or fluid collection. Moderate atherosclerotic calcification. Celiac: Variant anatomy with separate origins of the splenic and common hepatic artery from the aorta. Widely patent. No dissection or aneurysm. Left gastric artery arises from the splenic artery. SMA: Patent  without evidence of aneurysm, dissection, vasculitis or significant stenosis. Renals: The solitary right renal artery demonstrates a subtly beaded appearance of its mid segment suggesting changes of fibromuscular dysplasia. The left renal artery demonstrates normal vascular morphology. No hemodynamically significant stenosis. No aneurysm or dissection. IMA: Patent without evidence of aneurysm, dissection, vasculitis or significant stenosis. Inflow: Patent without evidence of aneurysm, dissection, vasculitis or significant stenosis. Internal iliac arteries are patent bilaterally. Proximal Outflow: Bilateral common femoral and visualized portions of the superficial and profunda femoral arteries are patent without evidence of aneurysm, dissection, vasculitis or significant stenosis. Veins: Unremarkable. Specifically, the portal vein, superior mesenteric vein, inferior mesenteric vein, and splenic vein are widely patent. Review of the MIP images confirms the above findings. NON-VASCULAR Lower chest: No acute abnormality. Stable right lower pulmonary nodule since remote prior examination of 02/23/2020, safely considered benign. Hepatobiliary: No focal liver abnormality is seen. Status post cholecystectomy. No biliary dilatation. Pancreas: Unremarkable  Spleen: Unremarkable Adrenals/Urinary Tract: Adrenal glands are unremarkable. Kidneys are normal, without renal calculi, focal lesion, or hydronephrosis. Bladder is unremarkable. Stomach/Bowel: There is mild circumferential bowel wall thickening and subtle pericolonic inflammatory stranding involving the mid sigmoid colon in keeping with changes of a infectious, inflammatory, or ischemic colitis. No evidence of obstruction or perforation. Moderate colonic stool burden. No free intraperitoneal gas or fluid. Stomach, small bowel, and large bowel are otherwise unremarkable. Appendix normal. Lymphatic: No pathologic adenopathy within the abdomen and pelvis. Reproductive: Status  post hysterectomy. No adnexal masses. Other: No abdominal wall hernia Musculoskeletal: No acute bone abnormality. No lytic or blastic bone lesion. Intramuscular lipoma within the right internal oblique muscle measuring 4.7 cm. IMPRESSION: 1. No evidence of hemodynamically significant stenosis or occlusion involving the large vessel arterial and venous mesenteric vasculature. 2. Beaded appearance of the mid segment of the solitary right renal artery suggesting changes of fibromuscular dysplasia. 3. Moderate atherosclerotic calcification. 4. Mild circumferential bowel wall thickening and subtle pericolonic inflammatory stranding involving the mid sigmoid colon in keeping with changes of a infectious, inflammatory, or ischemic colitis. No evidence of obstruction or perforation. 5. Moderate colonic stool burden. Aortic Atherosclerosis (ICD10-I70.0). Electronically Signed   By: Helyn Numbers M.D.   On: 12/20/2022 20:20    Procedures Procedures    Medications Ordered in ED Medications  morphine (PF) 4 MG/ML injection 4 mg (4 mg Intravenous Given 12/20/22 1721)  ondansetron (ZOFRAN) injection 4 mg (4 mg Intravenous Given 12/20/22 1721)  iohexol (OMNIPAQUE) 350 MG/ML injection 100 mL (100 mLs Intravenous Contrast Given 12/20/22 1751)  fentaNYL (SUBLIMAZE) injection 50 mcg (50 mcg Intravenous Given 12/20/22 1932)  amoxicillin-clavulanate (AUGMENTIN) 875-125 MG per tablet 1 tablet (1 tablet Oral Given 12/20/22 2101)    ED Course/ Medical Decision Making/ A&P                                 Medical Decision Making Amount and/or Complexity of Data Reviewed Labs: ordered. Radiology: ordered.  Risk Prescription drug management.   BP (!) 153/73   Pulse 81   Temp 99 F (37.2 C) (Oral)   Resp (!) 22   Ht 5\' 3"  (1.6 m)   Wt 84.4 kg   SpO2 97%   BMI 32.97 kg/m   52:39 PM  67 year old female with history of COPD, hyperlipidemia, PTSD, panic attack, spinal stenosis, sleep apnea, sent here from GI  office for evaluation of abdominal pain and constipation.  Patient endorse for the past 2 weeks she has had progressive worsening diffuse abdominal pain.  Along with the pain she also endorsed feeling nauseous, occasional vomiting and having constipation.  Pain is moderate to severe and she also endorsed having chills.  She does not endorse any fever chest pain shortness of breath productive cough or urinary symptoms.  She mentions she was seen at an outside hospital recently for her symptoms.  States she had a CT scan done and it was unremarkable.  She was prescribed MiraLAX and she did took it for about a week with some improvement of her symptoms however after she stopped taking MiraLAX, constipation returns.  She feels like her abdomen is distended.  She does take chronic opiate use medication but that is not new.  She was seen at the GI office today and sent here for further workup with instruction to obtain abdominal pelvis CT scan with angiogram.  On exam patient appears uncomfortable, moaning, writhing  around in bed.  Heart with normal rate and rhythm, lungs clear to auscultation abdomen is distended and diffusely tender with guarding but no rebound tenderness.  Bowel sounds present.  EMR reviewed patient was seen at GI office today for her complaint.  She was recommended to come to ER for further workup including abdominal pelvis CT scan with angiogram for further assessment.  She has a previous CT of the abdomen pelvis done less than a week ago that was unremarkable.   -Labs ordered, independently viewed and interpreted by me.  Labs remarkable for normal WBC, normal H&H, UA without UTI, normal lipase -The patient was maintained on a cardiac monitor.  I personally viewed and interpreted the cardiac monitored which showed an underlying rhythm of: NSR -Imaging independently viewed and interpreted by me and I agree with radiologist's interpretation.  Result remarkable for abd/pelvis CTA showing mild  colitis.  -This patient presents to the ED for concern of abd pain, this involves an extensive number of treatment options, and is a complaint that carries with it a high risk of complications and morbidity.  The differential diagnosis includes diverticulitis, colitis, appendicitis, cholecystitis, pancreatitis, UTI, kidney stone -Co morbidities that complicate the patient evaluation includes colitis, COPD, anxiety -Treatment includes fentanyl, morphine, augmentin -Reevaluation of the patient after these medicines showed that the patient improved -PCP office notes or outside notes reviewed -Discussion with attending Dr. Posey Rea -Escalation to admission/observation considered: patients feels much better, is comfortable with discharge, and will follow up with PCP -Prescription medication considered, patient comfortable with augmentin, zofran and miralax -Social Determinant of Health considered which includes tobacco use         Final Clinical Impression(s) / ED Diagnoses Final diagnoses:  Colitis    Rx / DC Orders ED Discharge Orders          Ordered    amoxicillin-clavulanate (AUGMENTIN) 875-125 MG tablet  Every 12 hours        12/20/22 2111    ondansetron (ZOFRAN) 4 MG tablet  Every 8 hours PRN        12/20/22 2111    polyethylene glycol powder (GLYCOLAX/MIRALAX) 17 GM/SCOOP powder  Daily        12/20/22 2112              Fayrene Helper, PA-C 12/20/22 2113    Terrilee Files, MD 12/21/22 1019

## 2022-12-20 NOTE — Patient Instructions (Addendum)
Patient was advised to go to the ER to have further evaluation, may consider proceeding with a CT angio of the abdomen and pelvis with IV contrast Continue MiraLAX daily.  Can increase it to 3 capfuls per day to achieve more regular bowel movements.

## 2022-12-20 NOTE — ED Triage Notes (Signed)
Pt reports lower abd pain and constipation x 2 weeks.  Reports was seen at the hospital in [place] and had a negative ct scan and was given miralax.  Pt reports she had a small bm with the miralax but has gone a week without a BM again.  Pt reports she just came from the GI office.

## 2022-12-20 NOTE — Discharge Instructions (Addendum)
You have been evaluated for your symptoms.  CT scan today shows evidence of colitis which is inflammation of your colon.  Please take antibiotic as prescribed.  Take MiraLAX to help with constipation.  Take Zofran as needed for nausea.  Call and follow-up closely with your GI specialist for outpatient evaluation.  Return if you have any concern.

## 2022-12-20 NOTE — ED Notes (Signed)
Back from CT. Reports no change in pain after morphine. Rates 8/10.

## 2022-12-20 NOTE — ED Notes (Signed)
Went to call pt back to ED room 24, no longer in [place] Pt not in lobby. Pt not front. Pt not outside

## 2022-12-31 ENCOUNTER — Encounter (INDEPENDENT_AMBULATORY_CARE_PROVIDER_SITE_OTHER): Payer: Self-pay | Admitting: Gastroenterology

## 2022-12-31 ENCOUNTER — Ambulatory Visit (INDEPENDENT_AMBULATORY_CARE_PROVIDER_SITE_OTHER): Payer: 59 | Admitting: Gastroenterology

## 2022-12-31 VITALS — BP 130/84 | HR 84 | Temp 98.8°F | Ht 63.0 in | Wt 183.9 lb

## 2022-12-31 DIAGNOSIS — K529 Noninfective gastroenteritis and colitis, unspecified: Secondary | ICD-10-CM

## 2022-12-31 DIAGNOSIS — A09 Infectious gastroenteritis and colitis, unspecified: Secondary | ICD-10-CM

## 2022-12-31 DIAGNOSIS — K589 Irritable bowel syndrome without diarrhea: Secondary | ICD-10-CM | POA: Insufficient documentation

## 2022-12-31 DIAGNOSIS — K581 Irritable bowel syndrome with constipation: Secondary | ICD-10-CM

## 2022-12-31 DIAGNOSIS — K5900 Constipation, unspecified: Secondary | ICD-10-CM | POA: Diagnosis not present

## 2022-12-31 MED ORDER — LINACLOTIDE 290 MCG PO CAPS
290.0000 ug | ORAL_CAPSULE | Freq: Every day | ORAL | 3 refills | Status: DC
Start: 2022-12-31 — End: 2023-03-28

## 2022-12-31 MED ORDER — PEG 3350-KCL-NA BICARB-NACL 420 G PO SOLR
4000.0000 mL | Freq: Once | ORAL | 0 refills | Status: AC
Start: 2022-12-31 — End: 2022-12-31

## 2022-12-31 NOTE — Progress Notes (Signed)
Katrinka Blazing, M.D. Gastroenterology & Hepatology [facility] [address] [place], [place] [address]  Primary Care Physician: Toma Deiters, MD [address] [place] [address]  I will communicate my assessment and recommendations to the referring MD via EMR.  Problems: Subacute abdominal pain. Chronic constipation due to combination of IBS-C and opiate use  History of Present Illness: Audrey Kline is a 67 y.o. female with past medical history of COPD, hyperlipidemia, panic attacks, PTSD, IBS-C, chronic opiate use, sleep apnea, spinal stenosis, who presents for evaluation of abdominal pain and constipation.   The patient was last seen on 12/20/2022. At that time, the patient was complaining of severe abdominal pain.  She was advised to go to the ER to have further evaluation, but was also advised to increase the amount of MiraLAX she was taking to have more frequent bowel movements.  Patient went to the ED at [facility] on the same day.  Blood workup showed normal CMP, CBC and urinalysis.  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  Notably, there was mild circumferential thickening and pericolonic inflammation involving the mid sigmoid colon concerning for infection, as well as moderate amount of stool burden.  The patient was discharged home on Augmentin every 12 hours and Zofran as needed.  She was also given a prescription for MiraLAX daily.  She finished her antibiotics already but did not feel resolution of her symptoms.  Patient reports that she is still having significant abdominal pain. She reports that she is having constant abdominal pain, but reports that her pain gets very severe when she moves her bowels.  States that even though she has had some defecation with  MiraLAX her symptoms have not completely resolved.  Is taking Miralax twice a day - has an average of 4-5 Bms per day, size of the stools can vary. She does not have appetite usually, but has been trying to eat soft foods.  The patient denies having any nausea, vomiting, fever, chills, hematochezia, melena, hematemesis, abdominal distention, abdominal pain, diarrhea, jaundice, pruritus or weight loss.  Last EGD: never Last Colonoscopy: 02/08/2022 - Three 3 to 6 mm polyps in the transverse colon and in the cecum, removed with a cold snare. Resected and retrieved. - Three 2 to 8 mm polyps in the rectum and in the descending colon, removed with a cold snare. Resected and retrieved. - Diverticulosis in the sigmoid colon. - The distal rectum and anal verge are normal on retroflexion view.   Pathology showed one colonic tubular adenoma and 2 SSLs, repeat colonoscopy in 5 years   Past Medical History: Past Medical History:  Diagnosis Date   COPD (chronic obstructive pulmonary disease) (HCC)    Hypercholesterolemia 2014   Osteoarthrosis 2013   Panic attacks 2014   PTSD (post-traumatic stress disorder) 2014   Sleep apnea 2014   Spinal stenosis of lumbar region 2014    Past Surgical History: Past Surgical History:  Procedure Laterality Date   ABDOMINAL HYSTERECTOMY     CARPAL TUNNEL RELEASE Right 2015   CHOLECYSTECTOMY     COLONOSCOPY WITH PROPOFOL N/A 02/08/2022   Procedure: COLONOSCOPY WITH PROPOFOL;  Surgeon: Dolores Frame, MD;  [facility];  Service: Gastroenterology;  Laterality: N/A;  1:45pm, asa 2   [place] Left 2014   HERNIA REPAIR  POLYPECTOMY  02/08/2022   Procedure: POLYPECTOMY INTESTINAL;  Surgeon: Dolores Frame, MD;  Location: [facility];  Service: Gastroenterology;;   SKIN BIOPSY      Family History:History reviewed. No pertinent family history.  Social History: Social History   Tobacco Use  Smoking Status Every Day   Current  packs/day: 0.50   Average packs/day: 0.5 packs/day for 25.0 years (12.5 ttl pk-yrs)   Types: Cigarettes  Smokeless Tobacco Never   Social History   Substance and Sexual Activity  Alcohol Use Not Currently   Social History   Substance and Sexual Activity  Drug Use Not Currently    Allergies: Allergies  Allergen Reactions   Nsaids Diarrhea   Pollen Extract Other (See Comments)    allergies   Latex Rash    Natural rubber   Naproxen Sodium Rash    Medications: Current Outpatient Medications  Medication Sig Dispense Refill   albuterol (VENTOLIN HFA) 108 (90 Base) MCG/ACT inhaler Inhale into the lungs every 6 (six) hours as needed for wheezing or shortness of breath.     ALPRAZolam (XANAX) 0.5 MG tablet Take 0.5 mg by mouth daily as needed for anxiety.     cycloSPORINE (RESTASIS) 0.05 % ophthalmic emulsion Place 1 drop into both eyes 2 (two) times daily.     metoprolol succinate (TOPROL-XL) 25 MG 24 hr tablet Take 25 mg by mouth daily.     omeprazole (PRILOSEC) 40 MG capsule Take 40 mg by mouth daily as needed (Heartburn).     ondansetron (ZOFRAN) 4 MG tablet Take 1 tablet (4 mg total) by mouth every 8 (eight) hours as needed for nausea or vomiting. 12 tablet 0   polyethylene glycol powder (GLYCOLAX/MIRALAX) 17 GM/SCOOP powder Take 17 g by mouth daily. 255 g 0   tiotropium (SPIRIVA) 18 MCG inhalation capsule Place 2.5 mcg into inhaler and inhale daily. 2 puff     No current facility-administered medications for this visit.    Review of Systems: GENERAL: negative for malaise, night sweats HEENT: No changes in hearing or vision, no nose bleeds or other nasal problems. NECK: Negative for lumps, goiter, pain and significant neck swelling RESPIRATORY: Negative for cough, wheezing CARDIOVASCULAR: Negative for chest pain, leg swelling, palpitations, orthopnea GI: SEE HPI MUSCULOSKELETAL: Negative for joint pain or swelling, back pain, and muscle pain. SKIN: Negative for lesions,  rash PSYCH: Negative for sleep disturbance, mood disorder and recent psychosocial stressors. HEMATOLOGY Negative for prolonged bleeding, bruising easily, and swollen nodes. ENDOCRINE: Negative for cold or heat intolerance, polyuria, polydipsia and goiter. NEURO: negative for tremor, gait imbalance, syncope and seizures. The remainder of the review of systems is noncontributory.   Physical Exam: BP 130/84 (BP Location: Left Arm, Patient Position: Sitting, Cuff Size: Large)   Pulse 84   Temp 98.8 F (37.1 C) (Temporal)   Ht 5\' 3"  (1.6 m)   Wt 183 lb 14.4 oz (83.4 kg)   BMI 32.58 kg/m  GENERAL: The patient is AO x3, in no acute distress.  Obese HEENT: Head is normocephalic and atraumatic. EOMI are intact. Mouth is well hydrated and without lesions. NECK: Supple. No masses LUNGS: Clear to auscultation. No presence of rhonchi/wheezing/rales. Adequate chest expansion HEART: RRR, normal s1 and s2. ABDOMEN: diffusely tender to palpation but is soft and no guarding, no peritoneal signs, and nondistended. BS +. No masses. EXTREMITIES: Without any cyanosis, clubbing, rash, lesions or edema. NEUROLOGIC: AOx3, no focal motor deficit. SKIN: no jaundice, no rashes  Imaging/Labs: as above  I personally reviewed and interpreted the available labs, imaging and endoscopic files.  Impression and Plan: Audrey Kline is a 67 y.o. female with past medical history of COPD, hyperlipidemia, panic attacks, PTSD, IBS-C, chronic opiate use, sleep apnea, spinal stenosis, who presents for evaluation of abdominal pain and constipation.  The patient has presented relatively recent onset of persistent abdominal pain.  Due to this, she had an evaluation in the ER and underwent CT angio of the abdomen and pelvis with IV contrast that showed some thickening of her colon which was read as infectious colitis, without significant relief of her symptoms.  I reviewed the imaging findings and concur there is significant  amount of stool in her colon.  I suspect her symptoms are primarily related to refractory constipation (combination of IBS-C and chronic opiate use), for which she would need to be given a bowel prep followed by Linzess daily.  She can stop taking MiraLAX for now.  If the patient presents worsening symptoms 2 weeks after starting the medication, we will consider scheduling a colonoscopy.  -Take bowel prep -Start Linzess 290 mcg qday starting on the day after finishing your bowel prep -Stop Miralax -If presenting persistent abdominal pain 2 weeks after taking the bowel prep, patient should notify us to schedule a repeat colonoscopy  All questions were answered.      Katrinka Blazing, MD Gastroenterology and Hepatology [facility]

## 2022-12-31 NOTE — Patient Instructions (Signed)
Take bowel prep Start Linzess 290 mcg qday starting on the day after finishing your bowel prep Stop Miralax If presenting persistent abdominal pain 2 weeks after taking the bowel prep, please notify us so we can schedule a repeat colonoscopy

## 2023-01-01 DIAGNOSIS — M25512 Pain in left shoulder: Secondary | ICD-10-CM | POA: Diagnosis not present

## 2023-01-01 DIAGNOSIS — M542 Cervicalgia: Secondary | ICD-10-CM | POA: Diagnosis not present

## 2023-01-01 DIAGNOSIS — M546 Pain in thoracic spine: Secondary | ICD-10-CM | POA: Diagnosis not present

## 2023-01-01 DIAGNOSIS — M549 Dorsalgia, unspecified: Secondary | ICD-10-CM | POA: Diagnosis not present

## 2023-01-01 DIAGNOSIS — Z79891 Long term (current) use of opiate analgesic: Secondary | ICD-10-CM | POA: Diagnosis not present

## 2023-01-01 DIAGNOSIS — M797 Fibromyalgia: Secondary | ICD-10-CM | POA: Diagnosis not present

## 2023-01-01 DIAGNOSIS — G894 Chronic pain syndrome: Secondary | ICD-10-CM | POA: Diagnosis not present

## 2023-01-01 DIAGNOSIS — M19042 Primary osteoarthritis, left hand: Secondary | ICD-10-CM | POA: Diagnosis not present

## 2023-01-01 DIAGNOSIS — M25511 Pain in right shoulder: Secondary | ICD-10-CM | POA: Diagnosis not present

## 2023-01-29 DIAGNOSIS — M25551 Pain in right hip: Secondary | ICD-10-CM | POA: Diagnosis not present

## 2023-01-29 DIAGNOSIS — M546 Pain in thoracic spine: Secondary | ICD-10-CM | POA: Diagnosis not present

## 2023-01-29 DIAGNOSIS — M549 Dorsalgia, unspecified: Secondary | ICD-10-CM | POA: Diagnosis not present

## 2023-01-29 DIAGNOSIS — G894 Chronic pain syndrome: Secondary | ICD-10-CM | POA: Diagnosis not present

## 2023-01-29 DIAGNOSIS — M542 Cervicalgia: Secondary | ICD-10-CM | POA: Diagnosis not present

## 2023-01-29 DIAGNOSIS — M25512 Pain in left shoulder: Secondary | ICD-10-CM | POA: Diagnosis not present

## 2023-01-29 DIAGNOSIS — M19042 Primary osteoarthritis, left hand: Secondary | ICD-10-CM | POA: Diagnosis not present

## 2023-01-29 DIAGNOSIS — M25511 Pain in right shoulder: Secondary | ICD-10-CM | POA: Diagnosis not present

## 2023-01-29 DIAGNOSIS — M797 Fibromyalgia: Secondary | ICD-10-CM | POA: Diagnosis not present

## 2023-02-05 ENCOUNTER — Ambulatory Visit: Payer: 59 | Admitting: Internal Medicine

## 2023-03-11 IMAGING — XA DG INJECT/[PERSON_NAME] INC NEEDLE/CATH/PLC EPI/CERV/THOR W/IMG
2 series · 2 of 2 positions shown · non-contrast
Comparison: none

CLINICAL DATA: Neck pain. Slight in cervical disc at C5-6 and C6-7.
Patient had excellent relief of pain from prior injection. Pain has
since recurred.

[Series 1: ortho standard · 1 of 1 slices shown (1 of 2)]
[im 1/1]
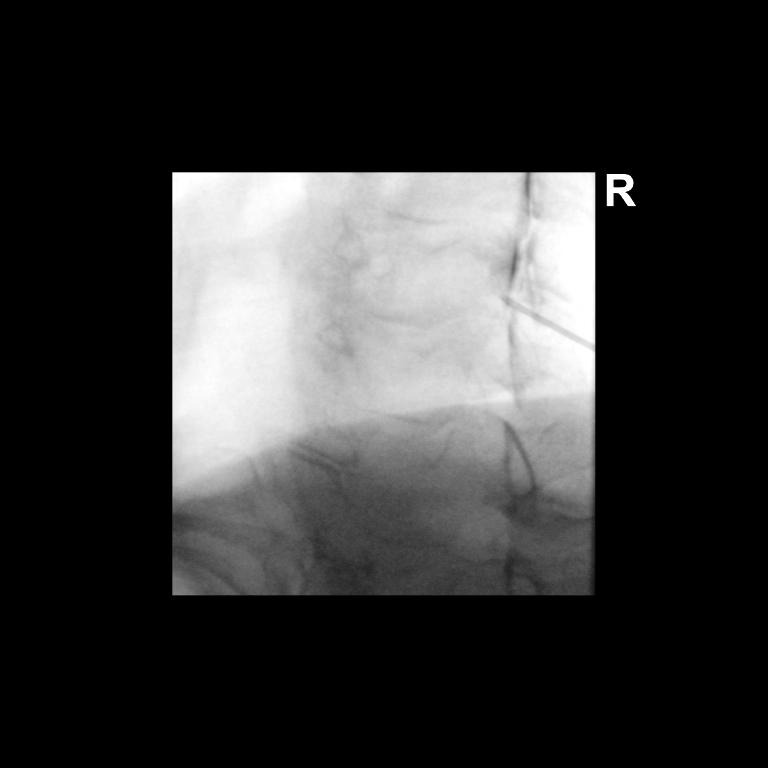

[Series 2: ortho standard · 1 of 1 slices shown (2 of 2)]
[im 1/1]
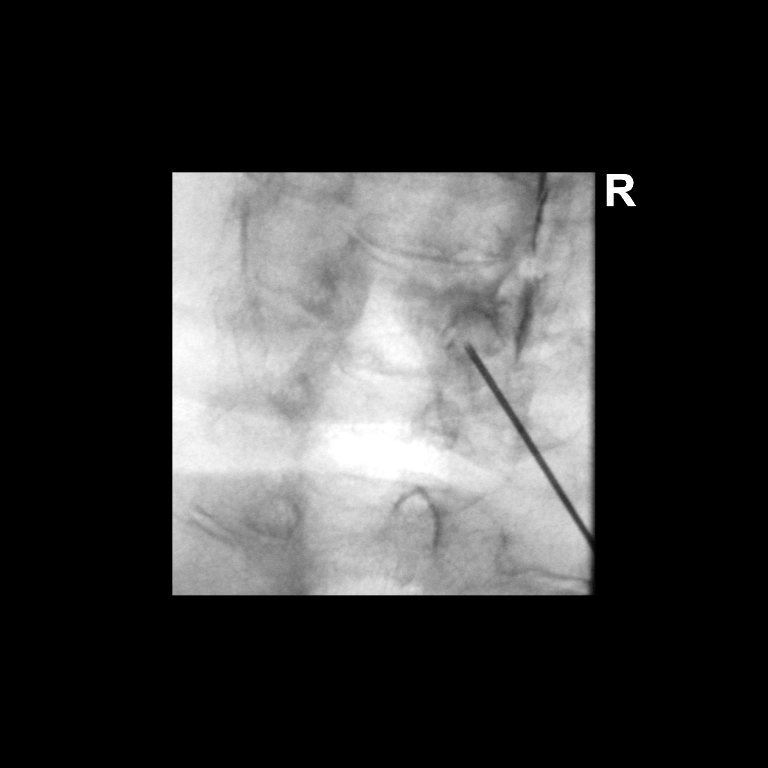

[2 of 2 positions shown; findings below may reference images not displayed]

FLUOROSCOPY TIME:  Radiation Exposure Index (as provided by the
fluoroscopic device): 7.98 uGy*m2

PROCEDURE:
CERVICAL EPIDURAL INJECTION

An interlaminar approach was performed on the right at C5-6. A 20
gauge epidural needle was advanced using loss-of-resistance
technique.

DIAGNOSTIC EPIDURAL INJECTION

Injection of Isovue-M 300 shows a good epidural pattern with spread
above and below the level of needle placement, primarily on the
right. No vascular opacification is seen. THERAPEUTIC

EPIDURAL INJECTION

1.5 ml of Kenalog 40 mixed with 1 ml of 1% Lidocaine and 2 ml of
normal saline were then instilled. The procedure was well-tolerated,
and the patient was discharged thirty minutes following the
injection in good condition.
IMPRESSION: Technically successful second epidural injection on the right at
C6-7.

## 2023-03-25 DIAGNOSIS — M25551 Pain in right hip: Secondary | ICD-10-CM | POA: Diagnosis not present

## 2023-03-25 DIAGNOSIS — Z79891 Long term (current) use of opiate analgesic: Secondary | ICD-10-CM | POA: Diagnosis not present

## 2023-03-25 DIAGNOSIS — M19042 Primary osteoarthritis, left hand: Secondary | ICD-10-CM | POA: Diagnosis not present

## 2023-03-25 DIAGNOSIS — M542 Cervicalgia: Secondary | ICD-10-CM | POA: Diagnosis not present

## 2023-03-25 DIAGNOSIS — M546 Pain in thoracic spine: Secondary | ICD-10-CM | POA: Diagnosis not present

## 2023-03-25 DIAGNOSIS — M25512 Pain in left shoulder: Secondary | ICD-10-CM | POA: Diagnosis not present

## 2023-03-25 DIAGNOSIS — M549 Dorsalgia, unspecified: Secondary | ICD-10-CM | POA: Diagnosis not present

## 2023-03-25 DIAGNOSIS — M25511 Pain in right shoulder: Secondary | ICD-10-CM | POA: Diagnosis not present

## 2023-03-25 DIAGNOSIS — M797 Fibromyalgia: Secondary | ICD-10-CM | POA: Diagnosis not present

## 2023-03-25 DIAGNOSIS — G894 Chronic pain syndrome: Secondary | ICD-10-CM | POA: Diagnosis not present

## 2023-03-28 ENCOUNTER — Encounter: Payer: Self-pay | Admitting: Internal Medicine

## 2023-03-28 ENCOUNTER — Ambulatory Visit (INDEPENDENT_AMBULATORY_CARE_PROVIDER_SITE_OTHER): Payer: 59 | Admitting: Internal Medicine

## 2023-03-28 VITALS — BP 134/85 | HR 74 | Ht 66.0 in | Wt 172.6 lb

## 2023-03-28 DIAGNOSIS — R739 Hyperglycemia, unspecified: Secondary | ICD-10-CM

## 2023-03-28 DIAGNOSIS — F4310 Post-traumatic stress disorder, unspecified: Secondary | ICD-10-CM

## 2023-03-28 DIAGNOSIS — Z780 Asymptomatic menopausal state: Secondary | ICD-10-CM

## 2023-03-28 DIAGNOSIS — E782 Mixed hyperlipidemia: Secondary | ICD-10-CM | POA: Diagnosis not present

## 2023-03-28 DIAGNOSIS — Z720 Tobacco use: Secondary | ICD-10-CM | POA: Diagnosis not present

## 2023-03-28 DIAGNOSIS — K581 Irritable bowel syndrome with constipation: Secondary | ICD-10-CM | POA: Diagnosis not present

## 2023-03-28 DIAGNOSIS — F411 Generalized anxiety disorder: Secondary | ICD-10-CM | POA: Diagnosis not present

## 2023-03-28 DIAGNOSIS — E559 Vitamin D deficiency, unspecified: Secondary | ICD-10-CM | POA: Diagnosis not present

## 2023-03-28 DIAGNOSIS — Z23 Encounter for immunization: Secondary | ICD-10-CM

## 2023-03-28 DIAGNOSIS — Z1159 Encounter for screening for other viral diseases: Secondary | ICD-10-CM | POA: Diagnosis not present

## 2023-03-28 DIAGNOSIS — F17210 Nicotine dependence, cigarettes, uncomplicated: Secondary | ICD-10-CM

## 2023-03-28 DIAGNOSIS — Z85828 Personal history of other malignant neoplasm of skin: Secondary | ICD-10-CM | POA: Insufficient documentation

## 2023-03-28 DIAGNOSIS — J449 Chronic obstructive pulmonary disease, unspecified: Secondary | ICD-10-CM

## 2023-03-28 DIAGNOSIS — Z1231 Encounter for screening mammogram for malignant neoplasm of breast: Secondary | ICD-10-CM

## 2023-03-28 DIAGNOSIS — G894 Chronic pain syndrome: Secondary | ICD-10-CM | POA: Diagnosis not present

## 2023-03-28 MED ORDER — ALBUTEROL SULFATE HFA 108 (90 BASE) MCG/ACT IN AERS: 1.0000 | INHALATION_SPRAY | Freq: Four times a day (QID) | RESPIRATORY_TRACT | 3 refills | Status: DC | PRN

## 2023-03-28 MED ORDER — TRELEGY ELLIPTA 100-62.5-25 MCG/ACT IN AEPB
1.0000 | INHALATION_SPRAY | Freq: Every day | RESPIRATORY_TRACT | 11 refills | Status: DC
Start: 2023-03-28 — End: 2023-12-11

## 2023-03-28 MED ORDER — ALPRAZOLAM 0.5 MG PO TABS
0.5000 mg | ORAL_TABLET | Freq: Every day | ORAL | 1 refills | Status: DC | PRN
Start: 2023-03-28 — End: 2023-06-27

## 2023-03-28 NOTE — Assessment & Plan Note (Signed)
S/p excision Referred to Dermatology -Dr. Margo Aye, local preference

## 2023-03-28 NOTE — Assessment & Plan Note (Signed)
Did not tolerate SSRIs and SNRIs in the past Takes Xanax 0.5 mg nightly as needed for anxiety and/or insomnia, refilled - PDMP reviewed

## 2023-03-28 NOTE — Assessment & Plan Note (Signed)
Uncontrolled due to noncompliance Start Trelegy as maintenance inhaler Refilled albuterol inhaler as needed for dyspnea or wheezing Needs to cut down -> quit smoking

## 2023-03-28 NOTE — Assessment & Plan Note (Signed)
Did not tolerate statin in the past Advised to follow low cholesterol diet
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
## 2023-03-28 NOTE — Assessment & Plan Note (Signed)
Smokes about 15 cigarettes/day  Asked about quitting: confirms that he/she currently smokes cigarettes Advise to quit smoking: Educated about QUITTING to reduce the risk of cancer, cardio and cerebrovascular disease. Assess willingness: Unwilling to quit at this time, but is working on cutting back. Assist with counseling and pharmacotherapy: Counseled for 5 minutes and literature provided. Arrange for follow up: follow up in 3 months and continue to offer help.

## 2023-03-28 NOTE — Patient Instructions (Addendum)
Please schedule annual wellness visit after after 05/16/2023  Please schedule Mammogram & Bone Density  Please start using Trelegy regularly and use Albuterol as needed for shortness of breath or wheezing.  Please take Xanax only as needed for severe anxiety.

## 2023-03-28 NOTE — Assessment & Plan Note (Signed)
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
## 2023-03-29 LAB — LIPID PANEL
Chol/HDL Ratio: 3.9 {ratio} (ref 0.0–4.4)
Cholesterol, Total: 197 mg/dL (ref 100–199)
HDL: 51 mg/dL (ref >39)
LDL Chol Calc (NIH): 120 mg/dL — ABNORMAL HIGH (ref 0–99)
Triglycerides: 144 mg/dL (ref 0–149)
VLDL Cholesterol Cal: 26 mg/dL (ref 5–40)

## 2023-03-29 LAB — CBC WITH DIFFERENTIAL/PLATELET
Basophils Absolute: 0.1 10*3/uL (ref 0.0–0.2)
Basos: 1 %
EOS (ABSOLUTE): 0.2 10*3/uL (ref 0.0–0.4)
Eos: 2 %
Hematocrit: 41.5 % (ref 34.0–46.6)
Hemoglobin: 13.5 g/dL (ref 11.1–15.9)
Immature Grans (Abs): 0 10*3/uL (ref 0.0–0.1)
Immature Granulocytes: 0 %
Lymphocytes Absolute: 3.3 10*3/uL — ABNORMAL HIGH (ref 0.7–3.1)
Lymphs: 35 %
MCH: 31 pg (ref 26.6–33.0)
MCHC: 32.5 g/dL (ref 31.5–35.7)
MCV: 95 fL (ref 79–97)
Monocytes Absolute: 0.6 10*3/uL (ref 0.1–0.9)
Monocytes: 7 %
Neutrophils Absolute: 5.2 10*3/uL (ref 1.4–7.0)
Neutrophils: 55 %
Platelets: 272 10*3/uL (ref 150–450)
RBC: 4.36 x10E6/uL (ref 3.77–5.28)
RDW: 11.8 % (ref 11.7–15.4)
WBC: 9.5 10*3/uL (ref 3.4–10.8)

## 2023-03-29 LAB — HEMOGLOBIN A1C
Est. average glucose Bld gHb Est-mCnc: 117 mg/dL
Hgb A1c MFr Bld: 5.7 % — ABNORMAL HIGH (ref 4.8–5.6)

## 2023-03-29 LAB — CMP14+EGFR
ALT: 11 [IU]/L (ref 0–32)
AST: 17 [IU]/L (ref 0–40)
Albumin: 4.4 g/dL (ref 3.9–4.9)
Alkaline Phosphatase: 104 [IU]/L (ref 44–121)
BUN/Creatinine Ratio: 13 (ref 12–28)
BUN: 9 mg/dL (ref 8–27)
Bilirubin Total: 0.9 mg/dL (ref 0.0–1.2)
CO2: 24 mmol/L (ref 20–29)
Calcium: 10.2 mg/dL (ref 8.7–10.3)
Chloride: 102 mmol/L (ref 96–106)
Creatinine, Ser: 0.69 mg/dL (ref 0.57–1.00)
Globulin, Total: 1.8 g/dL (ref 1.5–4.5)
Glucose: 103 mg/dL — ABNORMAL HIGH (ref 70–99)
Potassium: 4.4 mmol/L (ref 3.5–5.2)
Sodium: 140 mmol/L (ref 134–144)
Total Protein: 6.2 g/dL (ref 6.0–8.5)
eGFR: 95 mL/min/{1.73_m2} (ref >59)

## 2023-03-29 LAB — VITAMIN D 25 HYDROXY (VIT D DEFICIENCY, FRACTURES): Vit D, 25-Hydroxy: 24.2 ng/mL — ABNORMAL LOW (ref 30.0–100.0)

## 2023-03-29 LAB — TSH: TSH: 4.19 u[IU]/mL (ref 0.450–4.500)

## 2023-03-29 LAB — HEPATITIS C ANTIBODY: Hep C Virus Ab: NONREACTIVE

## 2023-04-02 ENCOUNTER — Ambulatory Visit (INDEPENDENT_AMBULATORY_CARE_PROVIDER_SITE_OTHER): Payer: 59 | Admitting: Gastroenterology

## 2023-04-03 ENCOUNTER — Other Ambulatory Visit (HOSPITAL_COMMUNITY): Payer: 59

## 2023-04-03 ENCOUNTER — Ambulatory Visit (HOSPITAL_COMMUNITY): Payer: 59

## 2023-04-11 ENCOUNTER — Ambulatory Visit (INDEPENDENT_AMBULATORY_CARE_PROVIDER_SITE_OTHER): Payer: 59 | Admitting: Gastroenterology

## 2023-04-11 ENCOUNTER — Encounter (INDEPENDENT_AMBULATORY_CARE_PROVIDER_SITE_OTHER): Payer: Self-pay | Admitting: Gastroenterology

## 2023-04-11 VITALS — BP 124/71 | HR 78 | Temp 97.8°F | Ht 66.0 in | Wt 168.3 lb

## 2023-04-11 DIAGNOSIS — T402X5A Adverse effect of other opioids, initial encounter: Secondary | ICD-10-CM

## 2023-04-11 DIAGNOSIS — K581 Irritable bowel syndrome with constipation: Secondary | ICD-10-CM

## 2023-04-11 DIAGNOSIS — K5903 Drug induced constipation: Secondary | ICD-10-CM | POA: Diagnosis not present

## 2023-04-11 MED ORDER — POLYETHYLENE GLYCOL 3350 17 GM/SCOOP PO POWD: 17.0000 g | Freq: Every day | ORAL | 2 refills | Status: AC

## 2023-04-11 NOTE — Patient Instructions (Signed)
Start taking Miralax 1 capful every day for one week. If bowel movements do not improve, increase to 2 capfuls every day. If after two weeks there is no improvement, increase to 2 capfuls in AM and one at night.

## 2023-04-11 NOTE — Progress Notes (Signed)
Audrey Kline, M.D. Gastroenterology & Hepatology [facility] [address] [place], [place] [address]  Primary Care Physician: Anabel Halon, MD [address] [place] [address]  I will communicate my assessment and recommendations to the referring MD via EMR.  Problems: Subacute abdominal pain. Chronic constipation due to combination of IBS-C and opiate use   History of Present Illness: Audrey Kline is a 68 y.o. female with past medical history of COPD, hyperlipidemia, panic attacks, PTSD, IBS-C, chronic opiate use, sleep apnea, spinal stenosis, who presents for evaluation of abdominal pain and constipation.   The patient was last seen on 12/31/2022. At that time, the patient was started on bowel prep and this was followed by Linzess 290 mcg every day.  Patient reports that she is moving her bowels every other day, but states most of the times she has to strain significantly. She did not tolerate the Linzess 290 mcg per day as she was having up to 4 bowel movements per day. She was not taking Miralax at that time. She is not taking anything for now. She feels bloated frequently.  Patient reports having recurrent episodes of pain in her abdomen diffusely, usually before having a BM. The pain stops after she defecates.  She has frequent nausea but only vomited a couple of times a day.  The patient denies having any   fever, chills, hematochezia, melena, hematemesis, diarrhea, jaundice, pruritus.  Last TSH 4.19  Last EGD: never Last Colonoscopy: 02/08/2022 - Three 3 to 6 mm polyps in the transverse colon and in the cecum, removed with a cold snare. Resected and retrieved. - Three 2 to 8 mm polyps in the rectum and in the descending colon, removed with a cold snare. Resected and retrieved. - Diverticulosis in the sigmoid colon. - The distal rectum and anal verge are normal on retroflexion view.   Pathology showed one colonic  tubular adenoma and 2 SSLs, repeat colonoscopy in 5 years   Past Medical History: Past Medical History:  Diagnosis Date   COPD (chronic obstructive pulmonary disease) (HCC)    Hypercholesterolemia 2014   Osteoarthrosis 2013   Panic attacks 2014   PTSD (post-traumatic stress disorder) 2014   Sleep apnea 2014   Spinal stenosis of lumbar region 2014    Past Surgical History: Past Surgical History:  Procedure Laterality Date   ABDOMINAL HYSTERECTOMY     CARPAL TUNNEL RELEASE Right 2015   CHOLECYSTECTOMY     COLONOSCOPY WITH PROPOFOL N/A 02/08/2022   Procedure: COLONOSCOPY WITH PROPOFOL;  Surgeon: Dolores Frame, MD;  [facility]: [facility];  Service: Gastroenterology;  Laterality: N/A;  1:45pm, asa 2   [place] Left 2014   HERNIA REPAIR     POLYPECTOMY  02/08/2022   Procedure: POLYPECTOMY INTESTINAL;  Surgeon: Dolores Frame, MD;  Location: [facility];  Service: Gastroenterology;;   SKIN BIOPSY      Family History:History reviewed. No pertinent family history.  Social History: Social History   Tobacco Use  Smoking Status Every Day   Current packs/day: 0.50   Average packs/day: 0.5 packs/day for 25.0 years (12.5 ttl pk-yrs)   Types: Cigarettes  Smokeless Tobacco Never  Tobacco Comments   15 a day as of 03/28/2023    Social History   Substance and Sexual Activity  Alcohol Use Not Currently   Social History   Substance and Sexual Activity  Drug Use Not Currently    Allergies: Allergies  Allergen Reactions  Nsaids Diarrhea   Pollen Extract Other (See Comments)    allergies   Latex Rash    Natural rubber   Naproxen Sodium Rash    Medications: Current Outpatient Medications  Medication Sig Dispense Refill   albuterol (VENTOLIN HFA) 108 (90 Base) MCG/ACT inhaler Inhale 1-2 puffs into the lungs every 6 (six) hours as needed for wheezing or shortness of breath. 18 g 3   ALPRAZolam (XANAX) 0.5 MG tablet Take 1 tablet (0.5 mg total) by  mouth daily as needed for anxiety. 30 tablet 1   cycloSPORINE (RESTASIS) 0.05 % ophthalmic emulsion Place 1 drop into both eyes 2 (two) times daily.     Fluticasone-Umeclidin-Vilant (TRELEGY ELLIPTA) 100-62.5-25 MCG/ACT AEPB Inhale 1 puff into the lungs daily. 1 each 11   lidocaine (LIDODERM) 5 % Place 1 patch onto the skin as needed.     morphine (MSIR) 15 MG tablet Take 15 mg by mouth in the morning and at bedtime.     omeprazole (PRILOSEC) 40 MG capsule Take 40 mg by mouth daily as needed (Heartburn).     linaclotide (LINZESS) 290 MCG CAPS capsule Take 290 mcg by mouth daily before breakfast. (Patient not taking: Reported on 04/11/2023)     polyethylene glycol powder (GLYCOLAX/MIRALAX) 17 GM/SCOOP powder Take 17 g by mouth daily. (Patient not taking: Reported on 04/11/2023) 255 g 0   No current facility-administered medications for this visit.    Review of Systems: GENERAL: negative for malaise, night sweats HEENT: No changes in hearing or vision, no nose bleeds or other nasal problems. NECK: Negative for lumps, goiter, pain and significant neck swelling RESPIRATORY: Negative for cough, wheezing CARDIOVASCULAR: Negative for chest pain, leg swelling, palpitations, orthopnea GI: SEE HPI MUSCULOSKELETAL: Negative for joint pain or swelling, back pain, and muscle pain. SKIN: Negative for lesions, rash PSYCH: Negative for sleep disturbance, mood disorder and recent psychosocial stressors. HEMATOLOGY Negative for prolonged bleeding, bruising easily, and swollen nodes. ENDOCRINE: Negative for cold or heat intolerance, polyuria, polydipsia and goiter. NEURO: negative for tremor, gait imbalance, syncope and seizures. The remainder of the review of systems is noncontributory.   Physical Exam: BP 124/71 (BP Location: Left Arm, Patient Position: Sitting, Cuff Size: Large)   Pulse 78   Temp 97.8 F (36.6 C) (Temporal)   Ht 5\' 6"  (1.676 m)   Wt 168 lb 4.8 oz (76.3 kg)   BMI 27.16 kg/m   GENERAL: The patient is AO x3, in no acute distress. HEENT: Head is normocephalic and atraumatic. EOMI are intact. Mouth is well hydrated and without lesions. NECK: Supple. No masses LUNGS: Clear to auscultation. No presence of rhonchi/wheezing/rales. Adequate chest expansion HEART: RRR, normal s1 and s2. ABDOMEN: tender in upper abdomen, no guarding, no peritoneal signs, and nondistended. BS +. No masses. EXTREMITIES: Without any cyanosis, clubbing, rash, lesions or edema. NEUROLOGIC: AOx3, no focal motor deficit. SKIN: no jaundice, no rashes  Imaging/Labs: as above  I personally reviewed and interpreted the available labs, imaging and endoscopic files.  Impression and Plan: Zyanya Glaza is a 68 y.o. female with past medical history of COPD, hyperlipidemia, panic attacks, PTSD, IBS-C, chronic opiate use, sleep apnea, spinal stenosis, who presents for evaluation of abdominal pain and constipation.  Patient has presented some constipation related to IBS but also worsened by the use of opiates, although this has been better compared to prior.  It has partially improved with dietary changes but she is not currently taking any laxatives.  She did not tolerate high-dose Linzess.  We discussed the possibility of trying MiraLAX on a regular basis and uptitrating as needed.  If this does not help, may go back to low-dose Linzess.  -Start taking Miralax 1 capful every day for one week. If bowel movements do not improve, increase to 2 capfuls every day. If after two weeks there is no improvement, increase to 2 capfuls in AM and one at night.  All questions were answered.      Audrey Blazing, MD Gastroenterology and Hepatology [facility]

## 2023-04-30 ENCOUNTER — Telehealth: Payer: Self-pay

## 2023-04-30 NOTE — Telephone Encounter (Signed)
 Copied from CRM [id number]. Topic: General - Other >> Apr 26, 2023  3:03 PM Suzette B wrote: Reason for CRM: Judeth Cornfield a Teacher, early years/pre for [employer] has called to see if the office had received a fax in regard to patient medication. Ms. Judeth Cornfield stated she will be refaxing over to the office, and you have any questions please call her at [phone]

## 2023-05-02 ENCOUNTER — Ambulatory Visit (HOSPITAL_COMMUNITY): Payer: 59

## 2023-05-02 ENCOUNTER — Other Ambulatory Visit (HOSPITAL_COMMUNITY): Payer: 59

## 2023-05-09 ENCOUNTER — Ambulatory Visit: Payer: 59 | Admitting: Dermatology

## 2023-05-20 DIAGNOSIS — M25551 Pain in right hip: Secondary | ICD-10-CM | POA: Diagnosis not present

## 2023-05-20 DIAGNOSIS — M546 Pain in thoracic spine: Secondary | ICD-10-CM | POA: Diagnosis not present

## 2023-05-20 DIAGNOSIS — G894 Chronic pain syndrome: Secondary | ICD-10-CM | POA: Diagnosis not present

## 2023-05-20 DIAGNOSIS — M25512 Pain in left shoulder: Secondary | ICD-10-CM | POA: Diagnosis not present

## 2023-05-20 DIAGNOSIS — M25511 Pain in right shoulder: Secondary | ICD-10-CM | POA: Diagnosis not present

## 2023-05-20 DIAGNOSIS — M542 Cervicalgia: Secondary | ICD-10-CM | POA: Diagnosis not present

## 2023-05-20 DIAGNOSIS — Z79891 Long term (current) use of opiate analgesic: Secondary | ICD-10-CM | POA: Diagnosis not present

## 2023-05-20 DIAGNOSIS — M549 Dorsalgia, unspecified: Secondary | ICD-10-CM | POA: Diagnosis not present

## 2023-05-20 DIAGNOSIS — M19042 Primary osteoarthritis, left hand: Secondary | ICD-10-CM | POA: Diagnosis not present

## 2023-05-20 DIAGNOSIS — M797 Fibromyalgia: Secondary | ICD-10-CM | POA: Diagnosis not present

## 2023-05-22 ENCOUNTER — Ambulatory Visit (INDEPENDENT_AMBULATORY_CARE_PROVIDER_SITE_OTHER): Payer: 59

## 2023-05-22 VITALS — Ht 63.0 in | Wt 158.0 lb

## 2023-05-22 DIAGNOSIS — Z0000 Encounter for general adult medical examination without abnormal findings: Secondary | ICD-10-CM

## 2023-05-22 NOTE — Patient Instructions (Signed)
 Audrey Kline , Thank you for taking time to come for your Medicare Wellness Visit. I appreciate your ongoing commitment to your health goals. Please review the following plan we discussed and let me know if I can assist you in the future.   Referrals/Orders/Follow-Ups/Clinician Recommendations:   Next Medicare Annual Wellness Visit:  May 26, 2024 at 10:00am telephone visit.   This is a list of the screening recommended for you and due dates:  Health Maintenance  Topic Date Due   DEXA scan (bone density measurement)  Never done   COVID-19 Vaccine (1) Never done   Mammogram  Never done   Zoster (Shingles) Vaccine (1 of 2) Never done   Flu Shot  06/10/2023*   Medicare Annual Wellness Visit  05/21/2024   Colon Cancer Screening  02/09/2027   DTaP/Tdap/Td vaccine (2 - Td or Tdap) 10/23/2029   Pneumonia Vaccine  Completed   Hepatitis C Screening  Completed   HPV Vaccine  Aged Out  *Topic was postponed. The date shown is not the original due date.    Advanced directives: (Declined) Advance directive discussed with you today. Even though you declined this today, please call our office should you change your mind, and we can give you the proper paperwork for you to fill out.  Next Medicare Annual Wellness Visit scheduled for next year: yes  Understanding Your Risk for Falls Millions of people have serious injuries from falls each year. It is important to understand your risk of falling. Talk with your health care provider about your risk and what you can do to lower it. If you do have a serious fall, make sure to tell your provider. Falling once raises your risk of falling again. How can falls affect me? Serious injuries from falls are common. These include: Broken bones, such as hip fractures. Head injuries, such as traumatic brain injuries (TBI) or concussions. A fear of falling can cause you to avoid activities and stay at home. This can make your muscles weaker and raise your risk for  a fall. What can increase my risk? There are a number of risk factors that increase your risk for falling. The more risk factors you have, the higher your risk of falling. Serious injuries from a fall happen most often to people who are older than 68 years old. Teenagers and young adults ages 22-29 are also at higher risk. Common risk factors include: Weakness in the lower body. Being generally weak or confused due to long-term (chronic) illness. Dizziness or balance problems. Poor vision. Medicines that cause dizziness or drowsiness. These may include: Medicines for your blood pressure, heart, anxiety, insomnia, or swelling (edema). Pain medicines. Muscle relaxants. Other risk factors include: Drinking alcohol. Having had a fall in the past. Having foot pain or wearing improper footwear. Working at a dangerous job. Having any of the following in your home: Tripping hazards, such as floor clutter or loose rugs. Poor lighting. Pets. Having dementia or memory loss. What actions can I take to lower my risk of falling?     Physical activity Stay physically fit. Do strength and balance exercises. Consider taking a regular class to build strength and balance. Yoga and tai chi are good options. Vision Have your eyes checked every year and your prescription for glasses or contacts updated as needed. Shoes and walking aids Wear non-skid shoes. Wear shoes that have rubber soles and low heels. Do not wear high heels. Do not walk around the house in socks or slippers. Use  a cane or walker as told by your provider. Home safety Attach secure railings on both sides of your stairs. Install grab bars for your bathtub, shower, and toilet. Use a non-skid mat in your bathtub or shower. Attach bath mats securely with double-sided, non-slip rug tape. Use good lighting in all rooms. Keep a flashlight near your bed. Make sure there is a clear path from your bed to the bathroom. Use night-lights. Do  not use throw rugs. Make sure all carpeting is taped or tacked down securely. Remove all clutter from walkways and stairways, including extension cords. Repair uneven or broken steps and floors. Avoid walking on icy or slippery surfaces. Walk on the grass instead of on icy or slick sidewalks. Use ice melter to get rid of ice on walkways in the winter. Use a cordless phone. Questions to ask your health care provider Can you help me check my risk for a fall? Do any of my medicines make me more likely to fall? Should I take a vitamin D supplement? What exercises can I do to improve my strength and balance? Should I make an appointment to have my vision checked? Do I need a bone density test to check for weak bones (osteoporosis)? Would it help to use a cane or a walker? Where to find more information Centers for Disease Control and Prevention, STEADI: TonerPromos.no Community-Based Fall Prevention Programs: TonerPromos.no [employer] on Aging: BaseRingTones.pl Contact a health care provider if: You fall at home. You are afraid of falling at home. You feel weak, drowsy, or dizzy. This information is not intended to replace advice given to you by your health care provider. Make sure you discuss any questions you have with your health care provider. Document Revised: 10/30/2021 Document Reviewed: 10/30/2021 Elsevier Patient Education  2024 Elsevier Inc.   Managing Pain Without Opioids Opioids are strong medicines used to treat moderate to severe pain. For some people, especially those who have long-term (chronic) pain, opioids may not be the best choice for pain management due to: Side effects like nausea, constipation, and sleepiness. The risk of addiction (opioid use disorder). The longer you take opioids, the greater your risk of addiction. Pain that lasts for more than 3 months is called chronic pain. Managing chronic pain usually requires more than one approach and is often provided by a team of  health care providers working together (multidisciplinary approach). Pain management may be done at a pain management center or pain clinic. How to manage pain without the use of opioids Use non-opioid medicines Non-opioid medicines for pain may include: Over-the-counter or prescription non-steroidal anti-inflammatory drugs (NSAIDs). These may be the first medicines used for pain. They work well for muscle and bone pain, and they reduce swelling. Acetaminophen. This over-the-counter medicine may work well for milder pain but not swelling. Antidepressants. These may be used to treat chronic pain. A certain type of antidepressant (tricyclics) is often used. These medicines are given in lower doses for pain than when used for depression. Anticonvulsants. These are usually used to treat seizures but may also reduce nerve (neuropathic) pain. Muscle relaxants. These relieve pain caused by sudden muscle tightening (spasms). You may also use a pain medicine that is applied to the skin as a patch, cream, or gel (topical analgesic), such as a numbing medicine. These may cause fewer side effects than medicines taken by mouth. Do certain therapies as directed Some therapies can help with pain management. They include: Physical therapy. You will do exercises to gain strength  and flexibility. A physical therapist may teach you exercises to move and stretch parts of your body that are weak, stiff, or painful. You can learn these exercises at physical therapy visits and practice them at home. Physical therapy may also involve: Massage. Heat wraps or applying heat or cold to affected areas. Electrical signals that interrupt pain signals (transcutaneous electrical nerve stimulation, TENS). Weak lasers that reduce pain and swelling (low-level laser therapy). Signals from your body that help you learn to regulate pain (biofeedback). Occupational therapy. This helps you to learn ways to function at home and work with  less pain. Recreational therapy. This involves trying new activities or hobbies, such as a physical activity or drawing. Mental health therapy, including: Cognitive behavioral therapy (CBT). This helps you learn coping skills for dealing with pain. Acceptance and commitment therapy (ACT) to change the way you think and react to pain. Relaxation therapies, including muscle relaxation exercises and mindfulness-based stress reduction. Pain management counseling. This may be individual, family, or group counseling.  Receive medical treatments Medical treatments for pain management include: Nerve block injections. These may include a pain blocker and anti-inflammatory medicines. You may have injections: Near the spine to relieve chronic back or neck pain. Into joints to relieve back or joint pain. Into nerve areas that supply a painful area to relieve body pain. Into muscles (trigger point injections) to relieve some painful muscle conditions. A medical device placed near your spine to help block pain signals and relieve nerve pain or chronic back pain (spinal cord stimulation device). Acupuncture. Follow these instructions at home Medicines Take over-the-counter and prescription medicines only as told by your health care provider. If you are taking pain medicine, ask your health care providers about possible side effects to watch out for. Do not drive or use heavy machinery while taking prescription opioid pain medicine. Lifestyle  Do not use drugs or alcohol to reduce pain. If you drink alcohol, limit how much you have to: 0-1 drink a day for women who are not pregnant. 0-2 drinks a day for men. Know how much alcohol is in a drink. In the [place] one drink equals one 12 oz bottle of beer (355 mL), one 5 oz glass of wine (148 mL), or one 1 oz glass of hard liquor (44 mL). Do not use any products that contain nicotine or tobacco. These products include cigarettes, chewing tobacco, and vaping  devices, such as e-cigarettes. If you need help quitting, ask your health care provider. Eat a healthy diet and maintain a healthy weight. Poor diet and excess weight may make pain worse. Eat foods that are high in fiber. These include fresh fruits and vegetables, whole grains, and beans. Limit foods that are high in fat and processed sugars, such as fried and sweet foods. Exercise regularly. Exercise lowers stress and may help relieve pain. Ask your health care provider what activities and exercises are safe for you. If your health care provider approves, join an exercise class that combines movement and stress reduction. Examples include yoga and tai chi. Get enough sleep. Lack of sleep may make pain worse. Lower stress as much as possible. Practice stress reduction techniques as told by your therapist. General instructions Work with all your pain management providers to find the treatments that work best for you. You are an important member of your pain management team. There are many things you can do to reduce pain on your own. Consider joining an online or in-person support group for people who  have chronic pain. Keep all follow-up visits. This is important. Where to find more information You can find more information about managing pain without opioids from: American Academy of Pain Medicine: painmed.org Institute for Chronic Pain: instituteforchronicpain.org American Chronic Pain Association: theacpa.org Contact a health care provider if: You have side effects from pain medicine. Your pain gets worse or does not get better with treatments or home therapy. You are struggling with anxiety or depression. Summary Many types of pain can be managed without opioids. Chronic pain may respond better to pain management without opioids. Pain is best managed when you and a team of health care providers work together. Pain management without opioids may include non-opioid medicines, medical  treatments, physical therapy, mental health therapy, and lifestyle changes. Tell your health care providers if your pain gets worse or is not being managed well enough. This information is not intended to replace advice given to you by your health care provider. Make sure you discuss any questions you have with your health care provider. Document Revised: 06/08/2020 Document Reviewed: 06/08/2020 Elsevier Patient Education  2024 [employer].

## 2023-05-22 NOTE — Progress Notes (Signed)
 Because this visit was a virtual/telehealth visit,  certain criteria was not obtained, such a blood pressure, CBG if applicable, and timed get up and go. Any medications not marked as "taking" were not mentioned during the medication reconciliation part of the visit. Any vitals not documented were not able to be obtained due to this being a telehealth visit or patient was unable to self-report a recent blood pressure reading due to a lack of equipment at home via telehealth. Vitals that have been documented are verbally provided by the patient.   Subjective:   Audrey Kline is a 68 y.o. who presents for a Medicare Wellness preventive visit.  Visit Complete: Virtual I connected with  Artemio Aly on 05/22/23 by a audio enabled telemedicine application and verified that I am speaking with the correct person using two identifiers.  Patient Location: Home  Provider Location: Home Office  I discussed the limitations of evaluation and management by telemedicine. The patient expressed understanding and agreed to proceed.  Vital Signs: Because this visit was a virtual/telehealth visit, some criteria may be missing or patient reported. Any vitals not documented were not able to be obtained and vitals that have been documented are patient reported.  VideoDeclined- This patient declined Librarian, academic. Therefore the visit was completed with audio only.  AWV Questionnaire: No: Patient Medicare AWV questionnaire was not completed prior to this visit.  Cardiac Risk Factors include: advanced age (>23men, >9 women);dyslipidemia;sedentary lifestyle     Objective:    Today's Vitals   05/22/23 1546 05/22/23 1551  Weight: 158 lb (71.7 kg)   Height: 5\' 3"  (1.6 m)   PainSc:  8    Body mass index is 27.99 kg/m.     05/22/2023    3:29 PM 12/20/2022    2:58 PM 02/08/2022   12:32 PM 03/08/2018    7:19 PM 03/02/2018   11:00 AM 03/01/2018   11:01 AM  Advanced  Directives  Does Patient Have a Medical Advance Directive? No No No No No No  Would patient like information on creating a medical advance directive? No - Patient declined  Yes (MAU/Ambulatory/Procedural Areas - Information given)  No - Patient declined     Current Medications (verified) Outpatient Encounter Medications as of 05/22/2023  Medication Sig   albuterol (VENTOLIN HFA) 108 (90 Base) MCG/ACT inhaler Inhale 1-2 puffs into the lungs every 6 (six) hours as needed for wheezing or shortness of breath.   ALPRAZolam (XANAX) 0.5 MG tablet Take 1 tablet (0.5 mg total) by mouth daily as needed for anxiety.   cycloSPORINE (RESTASIS) 0.05 % ophthalmic emulsion Place 1 drop into both eyes 2 (two) times daily.   Fluticasone-Umeclidin-Vilant (TRELEGY ELLIPTA) 100-62.5-25 MCG/ACT AEPB Inhale 1 puff into the lungs daily.   lidocaine (LIDODERM) 5 % Place 1 patch onto the skin as needed.   morphine (MSIR) 15 MG tablet Take 15 mg by mouth in the morning and at bedtime.   omeprazole (PRILOSEC) 40 MG capsule Take 40 mg by mouth daily as needed (Heartburn).   polyethylene glycol powder (GLYCOLAX/MIRALAX) 17 GM/SCOOP powder Take 17 g by mouth daily.   No facility-administered encounter medications on file as of 05/22/2023.    Allergies (verified) Nsaids, Pollen extract, Latex, and Naproxen sodium   History: Past Medical History:  Diagnosis Date   COPD (chronic obstructive pulmonary disease) (HCC)    Hypercholesterolemia 2014   Osteoarthrosis 2013   Panic attacks 2014   PTSD (post-traumatic stress disorder) 2014   Sleep  apnea 2014   Spinal stenosis of lumbar region 2014   Past Surgical History:  Procedure Laterality Date   ABDOMINAL HYSTERECTOMY     CARPAL TUNNEL RELEASE Right 2015   CHOLECYSTECTOMY     COLONOSCOPY WITH PROPOFOL N/A 02/08/2022   Procedure: COLONOSCOPY WITH PROPOFOL;  Surgeon: Dolores Frame, MD;  Location: [facility];  Service: Gastroenterology;  Laterality: N/A;   1:45pm, asa 2   [place] Left 2014   HERNIA REPAIR     POLYPECTOMY  02/08/2022   Procedure: POLYPECTOMY INTESTINAL;  Surgeon: Dolores Frame, MD;  Location: [facility];  Service: Gastroenterology;;   SKIN BIOPSY     History reviewed. No pertinent family history. Social History   Socioeconomic History   Marital status: Single    Spouse name: Not on file   Number of children: Not on file   Years of education: Not on file   Highest education level: Not on file  Occupational History   Not on file  Tobacco Use   Smoking status: Every Day    Current packs/day: 0.50    Average packs/day: 0.5 packs/day for 25.0 years (12.5 ttl pk-yrs)    Types: Cigarettes   Smokeless tobacco: Never   Tobacco comments:    15 a day as of 03/28/2023   Vaping Use   Vaping status: Never Used  Substance and Sexual Activity   Alcohol use: Not Currently   Drug use: Not Currently   Sexual activity: Not on file  Other Topics Concern   Not on file  Social History Narrative   Not on file   Social Drivers of Health   Financial Resource Strain: Low Risk  (05/22/2023)   Overall Financial Resource Strain (CARDIA)    Difficulty of Paying Living Expenses: Not hard at all  Food Insecurity: No Food Insecurity (05/22/2023)   Hunger Vital Sign    Worried About Running Out of Food in the Last Year: Never true    Ran Out of Food in the Last Year: Never true  Transportation Needs: No Transportation Needs (05/22/2023)   PRAPARE - Administrator, Civil Service (Medical): No    Lack of Transportation (Non-Medical): No  Physical Activity: Inactive (05/22/2023)   Exercise Vital Sign    Days of Exercise per Week: 0 days    Minutes of Exercise per Session: 0 min  Stress: Stress Concern Present (05/22/2023)   [employer] of Occupational Health - Occupational Stress Questionnaire    Feeling of Stress : Rather much  Social Connections: Unknown (05/22/2023)   Social Connection and Isolation  Panel [NHANES]    Frequency of Communication with Friends and Family: More than three times a week    Frequency of Social Gatherings with Friends and Family: More than three times a week    Attends Religious Services: Never    Database administrator or Organizations: No    Attends Engineer, structural: Never    Marital Status: Patient declined    Tobacco Counseling Ready to quit: Yes Counseling given: Yes Tobacco comments: 15 a day as of 03/28/2023     Clinical Intake:  Pre-visit preparation completed: Yes  Pain : 0-10 Pain Score: 8  Pain Type: Chronic pain Pain Location: Leg Pain Orientation: Right, Left Pain Descriptors / Indicators: Aching, Constant Pain Onset: More than a month ago Pain Frequency: Constant     BMI - recorded: 27.99 Nutritional Status: BMI 25 -29 Overweight Nutritional Risks: None Diabetes: No  How  often do you need to have someone help you when you read instructions, pamphlets, or other written materials from your doctor or pharmacy?: 1 - Never  Interpreter Needed?: No  Information entered by :: Maryjean Ka CMA   Activities of Daily Living     05/22/2023    3:29 PM  In your present state of health, do you have any difficulty performing the following activities:  Hearing? 0  Vision? 0  Difficulty concentrating or making decisions? 0  Walking or climbing stairs? 1  Dressing or bathing? 1  Doing errands, shopping? 1  Preparing Food and eating ? Y  Using the Toilet? Y  In the past six months, have you accidently leaked urine? N  Do you have problems with loss of bowel control? N  Managing your Medications? N  Managing your Finances? N  Housekeeping or managing your Housekeeping? Y    Patient Care Team: Anabel Halon, MD as PCP - General (Internal Medicine) [facility], [place] (Ophthalmology) Marguerita Merles, Reuel Boom, MD as Consulting Physician (Gastroenterology) Minda Ditto III, PA-C (Pain Medicine)  Indicate any  recent Medical Services you may have received from other than [facility] providers in the past year (date may be approximate).     Assessment:   This is a routine wellness examination for [place].  Hearing/Vision screen Hearing Screening - Comments:: Patient denies any hearing difficulties.   Vision Screening - Comments:: Wears rx glasses - up to date with routine eye exams  Patient sees [facility] [place] office   Goals Addressed             This Visit's Progress    Patient Stated       Increase activity       Depression Screen     05/22/2023    3:31 PM 03/28/2023    4:25 PM 03/28/2023    3:40 PM  PHQ 2/9 Scores  PHQ - 2 Score 2 1 0  PHQ- 9 Score 8 11 0    Fall Risk     05/22/2023    3:28 PM 03/28/2023    3:39 PM  Fall Risk   Falls in the past year? 1 0  Number falls in past yr: 1 0  Injury with Fall? 0 0  Risk for fall due to : History of fall(s);Impaired balance/gait;Orthopedic patient;Impaired mobility No Fall Risks  Follow up Falls prevention discussed;Education provided Falls evaluation completed    MEDICARE RISK AT HOME:  Medicare Risk at Home Any stairs in or around the home?: Yes If so, are there any without handrails?: No Home free of loose throw rugs in walkways, pet beds, electrical cords, etc?: Yes Adequate lighting in your home to reduce risk of falls?: Yes Life alert?: No Use of a cane, walker or w/c?: Yes Grab bars in the bathroom?: Yes Shower chair or bench in shower?: Yes Elevated toilet seat or a handicapped toilet?: No  TIMED UP AND GO:  Was the test performed?  No  Cognitive Function: 6CIT completed        05/22/2023    3:53 PM  6CIT Screen  What Year? 0 points  What month? 0 points  What time? 0 points  Count back from 20 0 points  Months in reverse 0 points  Repeat phrase 0 points  Total Score 0 points    Immunizations Immunization History  Administered Date(s) Administered   PNEUMOCOCCAL CONJUGATE-20  03/28/2023   Tdap 10/24/2019    Screening Tests Health  Maintenance  Topic Date Due   DEXA SCAN  Never done   COVID-19 Vaccine (1) Never done   MAMMOGRAM  Never done   Zoster Vaccines- Shingrix (1 of 2) Never done   INFLUENZA VACCINE  06/10/2023 (Originally 10/11/2022)   Medicare Annual Wellness (AWV)  05/21/2024   Colonoscopy  02/09/2027   DTaP/Tdap/Td (2 - Td or Tdap) 10/23/2029   Pneumonia Vaccine 73+ Years old  Completed   Hepatitis C Screening  Completed   HPV VACCINES  Aged Out    Health Maintenance  Health Maintenance Due  Topic Date Due   DEXA SCAN  Never done   COVID-19 Vaccine (1) Never done   MAMMOGRAM  Never done   Zoster Vaccines- Shingrix (1 of 2) Never done   Health Maintenance Items Addressed: Patient scheduled for bone density scan and mammogram  Additional Screening:  Vision Screening: Recommended annual ophthalmology exams for early detection of glaucoma and other disorders of the eye.  Dental Screening: Recommended annual dental exams for proper oral hygiene  Community Resource Referral / Chronic Care Management: CRR required this visit?  No   CCM required this visit?  No     Plan:     I have personally reviewed and noted the following in the patient's chart:   Medical and social history Use of alcohol, tobacco or illicit drugs  Current medications and supplements including opioid prescriptions. Patient is currently taking opioid prescriptions. Information provided to patient regarding non-opioid alternatives. Patient advised to discuss non-opioid treatment plan with their provider. Functional ability and status Nutritional status Physical activity Advanced directives List of other physicians Hospitalizations, surgeries, and ER visits in previous 12 months Vitals Screenings to include cognitive, depression, and falls Referrals and appointments  In addition, I have reviewed and discussed with patient certain preventive protocols, quality  metrics, and best practice recommendations. A written personalized care plan for preventive services as well as general preventive health recommendations were provided to patient.     Jordan Hawks Jonah Gingras, CMA   05/22/2023   After Visit Summary: (Mail) Due to this being a telephonic visit, the after visit summary with patients personalized plan was offered to patient via mail   Notes: Please refer to Routing Comments.

## 2023-05-30 ENCOUNTER — Ambulatory Visit: Payer: 59 | Admitting: Dermatology

## 2023-06-03 ENCOUNTER — Other Ambulatory Visit (HOSPITAL_COMMUNITY): Payer: 59

## 2023-06-03 ENCOUNTER — Ambulatory Visit (HOSPITAL_COMMUNITY): Payer: 59

## 2023-06-27 ENCOUNTER — Encounter: Payer: Self-pay | Admitting: Internal Medicine

## 2023-06-27 ENCOUNTER — Telehealth: Payer: Self-pay | Admitting: Internal Medicine

## 2023-06-27 ENCOUNTER — Ambulatory Visit (INDEPENDENT_AMBULATORY_CARE_PROVIDER_SITE_OTHER): Payer: 59 | Admitting: Internal Medicine

## 2023-06-27 VITALS — BP 124/62 | HR 77 | Ht 63.0 in | Wt 168.0 lb

## 2023-06-27 DIAGNOSIS — F411 Generalized anxiety disorder: Secondary | ICD-10-CM | POA: Diagnosis not present

## 2023-06-27 DIAGNOSIS — M5416 Radiculopathy, lumbar region: Secondary | ICD-10-CM

## 2023-06-27 DIAGNOSIS — K047 Periapical abscess without sinus: Secondary | ICD-10-CM | POA: Insufficient documentation

## 2023-06-27 DIAGNOSIS — G894 Chronic pain syndrome: Secondary | ICD-10-CM | POA: Diagnosis not present

## 2023-06-27 DIAGNOSIS — K581 Irritable bowel syndrome with constipation: Secondary | ICD-10-CM | POA: Diagnosis not present

## 2023-06-27 DIAGNOSIS — J449 Chronic obstructive pulmonary disease, unspecified: Secondary | ICD-10-CM

## 2023-06-27 DIAGNOSIS — Z0001 Encounter for general adult medical examination with abnormal findings: Secondary | ICD-10-CM

## 2023-06-27 DIAGNOSIS — Z720 Tobacco use: Secondary | ICD-10-CM | POA: Diagnosis not present

## 2023-06-27 DIAGNOSIS — E782 Mixed hyperlipidemia: Secondary | ICD-10-CM | POA: Diagnosis not present

## 2023-06-27 MED ORDER — AMOXICILLIN-POT CLAVULANATE 875-125 MG PO TABS
1.0000 | ORAL_TABLET | Freq: Two times a day (BID) | ORAL | 0 refills | Status: DC
Start: 2023-06-27 — End: 2023-12-11

## 2023-06-27 MED ORDER — ALPRAZOLAM 0.5 MG PO TABS
0.5000 mg | ORAL_TABLET | Freq: Every day | ORAL | 1 refills | Status: DC | PRN
Start: 2023-06-27 — End: 2023-10-24

## 2023-06-27 NOTE — Assessment & Plan Note (Signed)
 Has chronic low back pain and LE weakness Has gait disturbance due to neuropathic symptoms Referred to Home health for PT
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
## 2023-06-27 NOTE — Telephone Encounter (Signed)
 FYI, patient does not want to have no mammogram done til 2026.

## 2023-06-27 NOTE — Patient Instructions (Addendum)
 Please schedule mammogram and bone density.   Please start taking Augmentin as prescribed for dental pain and swelling. Please contact your Dentist for dental pain.  Please continue to take medications as prescribed.  Please continue to follow low carb diet and perform moderate exercise/walking as tolerated.

## 2023-06-27 NOTE — Assessment & Plan Note (Addendum)
 Smokes about 4-5 cigarettes/day, has cut down from 15 cigarettes/day  Asked about quitting: confirms that he/she currently smokes cigarettes Advise to quit smoking: Educated about QUITTING to reduce the risk of cancer, cardio and cerebrovascular disease. Assess willingness: Unwilling to quit at this time, but is working on cutting back. Assist with counseling and pharmacotherapy: Counseled for 5 minutes. Arrange for follow up: follow up in 3 months and continue to offer help.

## 2023-06-27 NOTE — Assessment & Plan Note (Signed)
 Did not tolerate statin in the past Advised to follow low cholesterol diet

## 2023-06-27 NOTE — Assessment & Plan Note (Signed)
 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

## 2023-06-27 NOTE — Assessment & Plan Note (Signed)
Physical exam as documented. Fasting blood tests today. Advised to get Shingrix vaccines at local pharmacy.

## 2023-06-27 NOTE — Assessment & Plan Note (Signed)
 Well-controlled now On Trelegy as maintenance inhaler Refilled albuterol inhaler as needed for dyspnea or wheezing Needs to cut down -> quit smoking

## 2023-06-27 NOTE — Assessment & Plan Note (Signed)
 Started empiric Augmentin Advised to perform cool compresses over facial swelling area Needs to contact Dentist
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
## 2023-06-27 NOTE — Assessment & Plan Note (Signed)
 Did not tolerate SSRIs and SNRIs in the past Takes Xanax 0.5 mg nightly as needed for anxiety and/or insomnia, refilled - PDMP reviewed

## 2023-07-09 ENCOUNTER — Other Ambulatory Visit: Payer: Self-pay | Admitting: Internal Medicine

## 2023-07-09 DIAGNOSIS — M5416 Radiculopathy, lumbar region: Secondary | ICD-10-CM

## 2023-07-09 NOTE — Telephone Encounter (Signed)
 Copied from CRM [id number]. Topic: Clinical - Medication Refill >> Jul 09, 2023  3:49 PM Lizabeth Riggs wrote: Most Recent Primary Care Visit:  Provider: Meldon Sport  Department: [facility]  Visit Type: PHYSICAL  Date: 06/27/2023  Medication: lidocaine  (LIDODERM ) 5 %   Has the patient contacted their pharmacy? Yes (Agent: If no, request that the patient contact the pharmacy for the refill. If patient does not wish to contact the pharmacy document the reason why and proceed with request.) (Agent: If yes, when and what did the pharmacy advise?) Pharmacy needs order to refill  Is this the correct pharmacy for this prescription? Yes If no, delete pharmacy and type the correct one.  This is the patient's preferred pharmacy:  [facility] [address], [place] - [address] [place] [address] Phone: [phone] Fax: [phone]   Has the prescription been filled recently? No  Is the patient out of the medication? No  Has the patient been seen for an appointment in the last year OR does the patient have an upcoming appointment? Yes  Can we respond through MyChart? No  Agent: Please be advised that Rx refills may take up to 3 business days. We ask that you follow-up with your pharmacy.

## 2023-07-11 MED ORDER — LIDOCAINE 5 % EX PTCH
1.0000 | MEDICATED_PATCH | CUTANEOUS | 1 refills | Status: DC | PRN
Start: 2023-07-11 — End: 2023-09-03

## 2023-07-15 DIAGNOSIS — G894 Chronic pain syndrome: Secondary | ICD-10-CM | POA: Diagnosis not present

## 2023-07-15 DIAGNOSIS — M797 Fibromyalgia: Secondary | ICD-10-CM | POA: Diagnosis not present

## 2023-07-15 DIAGNOSIS — M25511 Pain in right shoulder: Secondary | ICD-10-CM | POA: Diagnosis not present

## 2023-07-15 DIAGNOSIS — M542 Cervicalgia: Secondary | ICD-10-CM | POA: Diagnosis not present

## 2023-07-15 DIAGNOSIS — M546 Pain in thoracic spine: Secondary | ICD-10-CM | POA: Diagnosis not present

## 2023-07-15 DIAGNOSIS — M25551 Pain in right hip: Secondary | ICD-10-CM | POA: Diagnosis not present

## 2023-07-15 DIAGNOSIS — M549 Dorsalgia, unspecified: Secondary | ICD-10-CM | POA: Diagnosis not present

## 2023-07-15 DIAGNOSIS — M25512 Pain in left shoulder: Secondary | ICD-10-CM | POA: Diagnosis not present

## 2023-07-15 DIAGNOSIS — M19042 Primary osteoarthritis, left hand: Secondary | ICD-10-CM | POA: Diagnosis not present

## 2023-07-15 DIAGNOSIS — Z79891 Long term (current) use of opiate analgesic: Secondary | ICD-10-CM | POA: Diagnosis not present

## 2023-07-22 ENCOUNTER — Telehealth: Payer: Self-pay

## 2023-07-22 NOTE — Telephone Encounter (Signed)
 Copied from CRM [id number]. Topic: Clinical - Medical Advice >> Jul 22, 2023  9:09 AM Arminda Landmark wrote: Reason for CRM: Ammon Bales from [facility] is calling to get the approval for start of service on the 20th please because patient had to cancel today due to a family emergency  [phone] callback# voicemail is confidential.

## 2023-07-22 NOTE — Telephone Encounter (Signed)
 Verbal orders provided.

## 2023-08-21 ENCOUNTER — Encounter (INDEPENDENT_AMBULATORY_CARE_PROVIDER_SITE_OTHER): Payer: Self-pay | Admitting: Gastroenterology

## 2023-08-26 DIAGNOSIS — H02831 Dermatochalasis of right upper eyelid: Secondary | ICD-10-CM | POA: Diagnosis not present

## 2023-08-26 DIAGNOSIS — H25813 Combined forms of age-related cataract, bilateral: Secondary | ICD-10-CM | POA: Diagnosis not present

## 2023-08-26 DIAGNOSIS — H02834 Dermatochalasis of left upper eyelid: Secondary | ICD-10-CM | POA: Diagnosis not present

## 2023-08-26 DIAGNOSIS — H01001 Unspecified blepharitis right upper eyelid: Secondary | ICD-10-CM | POA: Diagnosis not present

## 2023-08-28 ENCOUNTER — Encounter (HOSPITAL_COMMUNITY): Payer: Self-pay | Admitting: Internal Medicine

## 2023-09-03 ENCOUNTER — Other Ambulatory Visit: Payer: Self-pay | Admitting: Internal Medicine

## 2023-09-03 DIAGNOSIS — M5416 Radiculopathy, lumbar region: Secondary | ICD-10-CM

## 2023-09-23 DIAGNOSIS — G894 Chronic pain syndrome: Secondary | ICD-10-CM | POA: Diagnosis not present

## 2023-09-23 DIAGNOSIS — M546 Pain in thoracic spine: Secondary | ICD-10-CM | POA: Diagnosis not present

## 2023-09-23 DIAGNOSIS — Z79891 Long term (current) use of opiate analgesic: Secondary | ICD-10-CM | POA: Diagnosis not present

## 2023-09-23 DIAGNOSIS — M25511 Pain in right shoulder: Secondary | ICD-10-CM | POA: Diagnosis not present

## 2023-09-23 DIAGNOSIS — M542 Cervicalgia: Secondary | ICD-10-CM | POA: Diagnosis not present

## 2023-09-23 DIAGNOSIS — M19042 Primary osteoarthritis, left hand: Secondary | ICD-10-CM | POA: Diagnosis not present

## 2023-09-23 DIAGNOSIS — M25551 Pain in right hip: Secondary | ICD-10-CM | POA: Diagnosis not present

## 2023-09-23 DIAGNOSIS — M25512 Pain in left shoulder: Secondary | ICD-10-CM | POA: Diagnosis not present

## 2023-09-23 DIAGNOSIS — M797 Fibromyalgia: Secondary | ICD-10-CM | POA: Diagnosis not present

## 2023-09-23 DIAGNOSIS — M549 Dorsalgia, unspecified: Secondary | ICD-10-CM | POA: Diagnosis not present

## 2023-10-06 ENCOUNTER — Other Ambulatory Visit: Payer: Self-pay | Admitting: Internal Medicine

## 2023-10-06 DIAGNOSIS — M5416 Radiculopathy, lumbar region: Secondary | ICD-10-CM

## 2023-10-16 ENCOUNTER — Encounter (HOSPITAL_COMMUNITY): Admission: RE | Admit: 2023-10-16 | Source: Ambulatory Visit

## 2023-10-18 ENCOUNTER — Other Ambulatory Visit: Payer: Self-pay | Admitting: Internal Medicine

## 2023-10-18 DIAGNOSIS — H04123 Dry eye syndrome of bilateral lacrimal glands: Secondary | ICD-10-CM

## 2023-10-18 MED ORDER — CYCLOSPORINE 0.05 % OP EMUL: 1.0000 [drp] | Freq: Two times a day (BID) | OPHTHALMIC | 2 refills | Status: DC

## 2023-10-21 ENCOUNTER — Ambulatory Visit (HOSPITAL_COMMUNITY): Admit: 2023-10-21 | Admitting: Ophthalmology

## 2023-10-21 SURGERY — PHACOEMULSIFICATION, CATARACT, WITH IOL INSERTION
Anesthesia: Monitor Anesthesia Care | Laterality: Right

## 2023-10-23 ENCOUNTER — Other Ambulatory Visit: Payer: Self-pay | Admitting: Internal Medicine

## 2023-10-23 DIAGNOSIS — F411 Generalized anxiety disorder: Secondary | ICD-10-CM

## 2023-10-24 ENCOUNTER — Ambulatory Visit: Admitting: Internal Medicine

## 2023-10-29 ENCOUNTER — Other Ambulatory Visit (HOSPITAL_COMMUNITY)

## 2023-11-04 ENCOUNTER — Ambulatory Visit (HOSPITAL_COMMUNITY): Admit: 2023-11-04 | Admitting: Ophthalmology

## 2023-11-04 SURGERY — PHACOEMULSIFICATION, CATARACT, WITH IOL INSERTION
Anesthesia: Monitor Anesthesia Care | Laterality: Left

## 2023-11-10 ENCOUNTER — Other Ambulatory Visit: Payer: Self-pay | Admitting: Internal Medicine

## 2023-11-10 DIAGNOSIS — M5416 Radiculopathy, lumbar region: Secondary | ICD-10-CM

## 2023-11-13 ENCOUNTER — Other Ambulatory Visit: Payer: Self-pay | Admitting: Internal Medicine

## 2023-11-13 ENCOUNTER — Ambulatory Visit: Payer: Self-pay | Admitting: Internal Medicine

## 2023-11-13 DIAGNOSIS — M5416 Radiculopathy, lumbar region: Secondary | ICD-10-CM

## 2023-11-13 NOTE — Telephone Encounter (Unsigned)
 Copied from CRM #[id number]. Topic: Clinical - Medication Refill >> Nov 13, 2023  9:52 AM Delon T wrote: Medication: lidocaine  (LIDODERM ) 5 %  Has the patient contacted their pharmacy? Yes (Agent: If no, request that the patient contact the pharmacy for the refill. If patient does not wish to contact the pharmacy document the reason why and proceed with request.) (Agent: If yes, when and what did the pharmacy advise?)  This is the patient's preferred pharmacy:  [facility] [address], [place] - [address] [address] Phone: [phone] Fax: [phone]  Is this the correct pharmacy for this prescription? Yes If no, delete pharmacy and type the correct one.   Has the prescription been filled recently? Yes  Is the patient out of the medication? No  Has the patient been seen for an appointment in the last year OR does the patient have an upcoming appointment? Yes  Can we respond through MyChart? No  Agent: Please be advised that Rx refills may take up to 3 business days. We ask that you follow-up with your pharmacy.

## 2023-11-18 DIAGNOSIS — M542 Cervicalgia: Secondary | ICD-10-CM | POA: Diagnosis not present

## 2023-11-18 DIAGNOSIS — M25511 Pain in right shoulder: Secondary | ICD-10-CM | POA: Diagnosis not present

## 2023-11-18 DIAGNOSIS — M25512 Pain in left shoulder: Secondary | ICD-10-CM | POA: Diagnosis not present

## 2023-11-18 DIAGNOSIS — M25551 Pain in right hip: Secondary | ICD-10-CM | POA: Diagnosis not present

## 2023-11-18 DIAGNOSIS — G894 Chronic pain syndrome: Secondary | ICD-10-CM | POA: Diagnosis not present

## 2023-11-18 DIAGNOSIS — M549 Dorsalgia, unspecified: Secondary | ICD-10-CM | POA: Diagnosis not present

## 2023-11-18 DIAGNOSIS — M19042 Primary osteoarthritis, left hand: Secondary | ICD-10-CM | POA: Diagnosis not present

## 2023-11-18 DIAGNOSIS — M797 Fibromyalgia: Secondary | ICD-10-CM | POA: Diagnosis not present

## 2023-11-18 DIAGNOSIS — Z79891 Long term (current) use of opiate analgesic: Secondary | ICD-10-CM | POA: Diagnosis not present

## 2023-11-18 DIAGNOSIS — M546 Pain in thoracic spine: Secondary | ICD-10-CM | POA: Diagnosis not present

## 2023-12-09 ENCOUNTER — Other Ambulatory Visit: Payer: Self-pay | Admitting: Internal Medicine

## 2023-12-09 DIAGNOSIS — M5416 Radiculopathy, lumbar region: Secondary | ICD-10-CM

## 2023-12-11 ENCOUNTER — Ambulatory Visit (INDEPENDENT_AMBULATORY_CARE_PROVIDER_SITE_OTHER): Payer: Self-pay | Admitting: Internal Medicine

## 2023-12-11 ENCOUNTER — Encounter: Payer: Self-pay | Admitting: Internal Medicine

## 2023-12-11 VITALS — BP 111/72 | HR 95 | Ht 60.0 in | Wt 173.0 lb

## 2023-12-11 DIAGNOSIS — Z79899 Other long term (current) drug therapy: Secondary | ICD-10-CM

## 2023-12-11 DIAGNOSIS — F331 Major depressive disorder, recurrent, moderate: Secondary | ICD-10-CM | POA: Insufficient documentation

## 2023-12-11 DIAGNOSIS — J449 Chronic obstructive pulmonary disease, unspecified: Secondary | ICD-10-CM | POA: Diagnosis not present

## 2023-12-11 DIAGNOSIS — M5416 Radiculopathy, lumbar region: Secondary | ICD-10-CM

## 2023-12-11 DIAGNOSIS — F411 Generalized anxiety disorder: Secondary | ICD-10-CM

## 2023-12-11 DIAGNOSIS — L821 Other seborrheic keratosis: Secondary | ICD-10-CM | POA: Diagnosis not present

## 2023-12-11 DIAGNOSIS — K047 Periapical abscess without sinus: Secondary | ICD-10-CM

## 2023-12-11 DIAGNOSIS — K219 Gastro-esophageal reflux disease without esophagitis: Secondary | ICD-10-CM | POA: Diagnosis not present

## 2023-12-11 MED ORDER — TRELEGY ELLIPTA 100-62.5-25 MCG/ACT IN AEPB: 1.0000 | INHALATION_SPRAY | Freq: Every day | RESPIRATORY_TRACT | 11 refills | Status: AC

## 2023-12-11 MED ORDER — SERTRALINE HCL 25 MG PO TABS: 25.0000 mg | ORAL_TABLET | Freq: Every day | ORAL | 3 refills | Status: DC

## 2023-12-11 MED ORDER — AMOXICILLIN-POT CLAVULANATE 875-125 MG PO TABS: 1.0000 | ORAL_TABLET | Freq: Two times a day (BID) | ORAL | 0 refills | Status: AC

## 2023-12-11 NOTE — Patient Instructions (Signed)
 Please start taking Augmentin  as prescribed for dental infection.  Please start taking Zoloft for depression.  Please continue to take medications as prescribed.  Please continue to follow low carb diet and ambulate as tolerated.

## 2023-12-11 NOTE — Progress Notes (Unsigned)
 Established Patient Office Visit  Subjective:  Patient ID: Audrey Kline, female    DOB: 1955/11/02  Age: 68 y.o. MRN: [id number]  CC:  Chief Complaint  Patient presents with   Ear Pain    Left ear pain.    Depression    Sx of depression due to illness of her daughter.    Skin Problem    Reports concerns about skin cancer.     HPI Nao Linz is a 68 y.o. female with past medical history of COPD, IBS, GERD, GAD, PTSD and tobacco abuse who presents for f/u of her chronic medical conditions.  COPD: She reports improvement in exertional dyspnea with Trelegy.  She uses albuterol  as needed for dyspnea or wheezing.  She has chronic cough and hoarse voice.  She smokes about 4-5 cigarettes/day now, has cut down from 15 cigarettes/day.   IBS-C and chronic abdominal pain: She recently had colitis, currently followed by GI.  She was given Linzess  by GI, which has improved her constipation.  Denies melena or hematochezia.   Chronic pain syndrome: She reports history of chronic mid and low back pain, has been on opioid medicines for the last 30 years.  She currently takes morphine  15 mg as needed for severe pain, followed by [facility].   GAD and PTSD: She has tried multiple different SSRI and SNRIs in the past, including but not limited to, Cymbalta , Wellbutrin, amongst others.  She currently takes Xanax  0.5 mg as needed for anxiety and/or insomnia.  She reports worsening of her depression due to to her daughter's multiple sclerosis.  She has variation in her appetite and insomnia as well.  Denies SI or HI currently. She lives alone in [place].  She reports having left sided facial pain and swelling, which started after dental procedure in 03/25 and is intermittent since then. She denies any fever or chills, but has difficulty chewing food due to pain.  She also has reported pain around left ear.  She has history of skin cancer.  She reports noticing a patch on RLE, which has been  stable in size and color recently.   Past Medical History:  Diagnosis Date   COPD (chronic obstructive pulmonary disease) (HCC)    Hypercholesterolemia 2014   Osteoarthrosis 2013   Panic attacks 2014   PTSD (post-traumatic stress disorder) 2014   Sleep apnea 2014   Spinal stenosis of lumbar region 2014    Past Surgical History:  Procedure Laterality Date   ABDOMINAL HYSTERECTOMY     CARPAL TUNNEL RELEASE Right 2015   CHOLECYSTECTOMY     COLONOSCOPY WITH PROPOFOL  N/A 02/08/2022   Procedure: COLONOSCOPY WITH PROPOFOL ;  Surgeon: Eartha Angelia Sieving, MD;  Location: [facility];  Service: Gastroenterology;  Laterality: N/A;  1:45pm, asa 2   [place] Left 2014   HERNIA REPAIR     POLYPECTOMY  02/08/2022   Procedure: POLYPECTOMY INTESTINAL;  Surgeon: Eartha Angelia Sieving, MD;  Location: [facility];  Service: Gastroenterology;;   SKIN BIOPSY      History reviewed. No pertinent family history.  Social History   Socioeconomic History   Marital status: Single    Spouse name: Not on file   Number of children: Not on file   Years of education: Not on file   Highest education level: Not on file  Occupational History   Not on file  Tobacco Use   Smoking status: Every Day    Current packs/day: 0.50    Average packs/day:  0.5 packs/day for 25.0 years (12.5 ttl pk-yrs)    Types: Cigarettes   Smokeless tobacco: Never   Tobacco comments:    15 a day as of 03/28/2023   Vaping Use   Vaping status: Never Used  Substance and Sexual Activity   Alcohol use: Not Currently   Drug use: Not Currently   Sexual activity: Not on file  Other Topics Concern   Not on file  Social History Narrative   Not on file   Social Drivers of Health   Financial Resource Strain: Low Risk  (05/22/2023)   Overall Financial Resource Strain (CARDIA)    Difficulty of Paying Living Expenses: Not hard at all  Food Insecurity: No Food Insecurity (05/22/2023)   Hunger Vital Sign    Worried About  Running Out of Food in the Last Year: Never true    Ran Out of Food in the Last Year: Never true  Transportation Needs: No Transportation Needs (05/22/2023)   PRAPARE - Administrator, Civil Service (Medical): No    Lack of Transportation (Non-Medical): No  Physical Activity: Inactive (05/22/2023)   Exercise Vital Sign    Days of Exercise per Week: 0 days    Minutes of Exercise per Session: 0 min  Stress: Stress Concern Present (05/22/2023)   [employer] of Occupational Health - Occupational Stress Questionnaire    Feeling of Stress : Rather much  Social Connections: Unknown (05/22/2023)   Social Connection and Isolation Panel    Frequency of Communication with Friends and Family: More than three times a week    Frequency of Social Gatherings with Friends and Family: More than three times a week    Attends Religious Services: Never    Database administrator or Organizations: No    Attends Banker Meetings: Never    Marital Status: Patient declined  Catering manager Violence: Not At Risk (05/22/2023)   Humiliation, Afraid, Rape, and Kick questionnaire    Fear of Current or Ex-Partner: No    Emotionally Abused: No    Physically Abused: No    Sexually Abused: No    Outpatient Medications Prior to Visit  Medication Sig Dispense Refill   albuterol  (VENTOLIN  HFA) 108 (90 Base) MCG/ACT inhaler Inhale 1-2 puffs into the lungs every 6 (six) hours as needed for wheezing or shortness of breath. 18 g 3   ALPRAZolam  (XANAX ) 0.5 MG tablet TAKE 1 TABLET BY MOUTH ONCE DAILY AS NEEDED FOR ANXIETY 30 tablet 2   cycloSPORINE  (RESTASIS ) 0.05 % ophthalmic emulsion Place 1 drop into both eyes 2 (two) times daily. 60 mL 2   lidocaine  (LIDODERM ) 5 % USE 1 PATCH EXTERNALLY ONCE DAILY AS NEEDED REMOVE  AFTER  12  HOURS 30 patch 0   omeprazole (PRILOSEC) 40 MG capsule Take 40 mg by mouth daily as needed (Heartburn).     Oxycodone HCl 10 MG TABS Take 10 mg by mouth every 6 (six)  hours as needed.     polyethylene glycol powder (GLYCOLAX /MIRALAX ) 17 GM/SCOOP powder Take 17 g by mouth daily. 255 g 2   amoxicillin -clavulanate (AUGMENTIN ) 875-125 MG tablet Take 1 tablet by mouth 2 (two) times daily. 14 tablet 0   Fluticasone-Umeclidin-Vilant (TRELEGY ELLIPTA ) 100-62.5-25 MCG/ACT AEPB Inhale 1 puff into the lungs daily. 1 each 11   No facility-administered medications prior to visit.    Allergies  Allergen Reactions   Nsaids Diarrhea   Pollen Extract Other (See Comments)    allergies   Latex  Rash    Natural rubber   Naproxen Sodium Rash    ROS Review of Systems  Constitutional:  Positive for fatigue. Negative for chills and fever.  HENT:  Positive for dental problem and facial swelling. Negative for congestion, sinus pressure, sinus pain and sore throat.   Eyes:  Negative for pain and discharge.  Respiratory:  Positive for cough (chronic) and shortness of breath (intermittent).   Cardiovascular:  Negative for chest pain and palpitations.  Gastrointestinal:  Positive for constipation. Negative for diarrhea, nausea and vomiting.  Endocrine: Negative for polydipsia and polyuria.  Genitourinary:  Negative for dysuria and hematuria.  Musculoskeletal:  Positive for arthralgias, back pain and neck pain. Negative for neck stiffness.  Skin:  Negative for rash.  Neurological:  Positive for weakness (B/l LE). Negative for dizziness.  Psychiatric/Behavioral:  Positive for dysphoric mood and sleep disturbance. Negative for agitation and behavioral problems. The patient is nervous/anxious.       Objective:    Physical Exam Vitals reviewed.  Constitutional:      General: She is not in acute distress.    Appearance: She is not diaphoretic.     Comments: Has a cane  HENT:     Head: Normocephalic and atraumatic.     Nose: Nose normal.     Mouth/Throat:     Mouth: Mucous membranes are moist.     Dentition: Abnormal dentition. Dental abscesses present.  Eyes:      General: No scleral icterus.    Extraocular Movements: Extraocular movements intact.  Cardiovascular:     Rate and Rhythm: Normal rate and regular rhythm.     Heart sounds: Normal heart sounds. No murmur heard. Pulmonary:     Breath sounds: Normal breath sounds. No wheezing or rales.  Musculoskeletal:     Cervical back: Neck supple.     Thoracic back: Tenderness present.     Lumbar back: Tenderness present. Decreased range of motion.     Right lower leg: No edema.     Left lower leg: No edema.  Skin:    General: Skin is warm.     Findings: Lesion present. No rash.  Neurological:     General: No focal deficit present.     Mental Status: She is alert and oriented to person, place, and time.     Cranial Nerves: No cranial nerve deficit.     Sensory: No sensory deficit.     Motor: Weakness (B/l LE - 3/5) present.  Psychiatric:        Mood and Affect: Mood is anxious.        Behavior: Behavior is cooperative.     BP 111/72   Pulse 95   Ht 5' (1.524 m)   Wt 173 lb (78.5 kg)   SpO2 95%   BMI 33.79 kg/m  Wt Readings from Last 3 Encounters:  12/11/23 173 lb (78.5 kg)  06/27/23 168 lb 0.6 oz (76.2 kg)  05/22/23 158 lb (71.7 kg)    Lab Results  Component Value Date   TSH 4.190 03/28/2023   Lab Results  Component Value Date   WBC 9.5 03/28/2023   HGB 13.5 03/28/2023   HCT 41.5 03/28/2023   MCV 95 03/28/2023   PLT 272 03/28/2023   Lab Results  Component Value Date   NA 140 03/28/2023   K 4.4 03/28/2023   CO2 24 03/28/2023   GLUCOSE 103 (H) 03/28/2023   BUN 9 03/28/2023   CREATININE 0.69 03/28/2023   BILITOT 0.9  03/28/2023   ALKPHOS 104 03/28/2023   AST 17 03/28/2023   ALT 11 03/28/2023   PROT 6.2 03/28/2023   ALBUMIN 4.4 03/28/2023   CALCIUM 10.2 03/28/2023   ANIONGAP 5 12/20/2022   EGFR 95 03/28/2023   Lab Results  Component Value Date   CHOL 197 03/28/2023   Lab Results  Component Value Date   HDL 51 03/28/2023   Lab Results  Component Value Date    LDLCALC 120 (H) 03/28/2023   Lab Results  Component Value Date   TRIG 144 03/28/2023   Lab Results  Component Value Date   CHOLHDL 3.9 03/28/2023   Lab Results  Component Value Date   HGBA1C 5.7 (H) 03/28/2023      Assessment & Plan:   Problem List Items Addressed This Visit       Respiratory   COPD (chronic obstructive pulmonary disease) (HCC)   Well-controlled now On Trelegy as maintenance inhaler Refilled albuterol  inhaler as needed for dyspnea or wheezing Needs to cut down -> quit smoking      Relevant Medications   Fluticasone-Umeclidin-Vilant (TRELEGY ELLIPTA ) 100-62.5-25 MCG/ACT AEPB     Digestive   GERD (gastroesophageal reflux disease)   Well-controlled with omeprazole 40 mg QD as needed      Dental abscess - Primary   Started empiric Augmentin  Advised to perform cool compresses over facial swelling area Needs to contact Dentist      Relevant Medications   amoxicillin -clavulanate (AUGMENTIN ) 875-125 MG tablet     Nervous and Auditory   Lumbar radiculopathy   Has chronic low back pain and LE weakness Has gait disturbance due to neuropathic symptoms Followed by [facility]-on oxycodone 10 mg Q6h as needed      Relevant Medications   sertraline (ZOLOFT) 25 MG tablet     Musculoskeletal and Integument   SK (seborrheic keratosis)   Skin lesion on RLE appears to be seborrheic keratosis Referred to dermatology-Dr. Shona, due to history of skin cancer      Relevant Orders   Ambulatory referral to Dermatology     Other   GAD (generalized anxiety disorder)   Did not tolerate SSRIs and SNRIs in the past Takes Xanax  0.5 mg nightly as needed for anxiety and/or insomnia, refilled - PDMP reviewed Due to recent worsening of MDD, started Zoloft 25 mg QD      Relevant Medications   sertraline (ZOLOFT) 25 MG tablet   Moderate episode of recurrent major depressive disorder (HCC)   Uncontrolled Has tried SSRI and SNRI in the past Trial of  Zoloft 25 mg QD, can adjust dose according to response      Relevant Medications   sertraline (ZOLOFT) 25 MG tablet   Other Visit Diagnoses       Chronic prescription benzodiazepine use       Relevant Orders   ToxASSURE Select 13 (MW), Urine        Meds ordered this encounter  Medications   amoxicillin -clavulanate (AUGMENTIN ) 875-125 MG tablet    Sig: Take 1 tablet by mouth 2 (two) times daily.    Dispense:  14 tablet    Refill:  0   sertraline (ZOLOFT) 25 MG tablet    Sig: Take 1 tablet (25 mg total) by mouth daily.    Dispense:  30 tablet    Refill:  3   Fluticasone-Umeclidin-Vilant (TRELEGY ELLIPTA ) 100-62.5-25 MCG/ACT AEPB    Sig: Inhale 1 puff into the lungs daily.    Dispense:  1 each  Refill:  11    Follow-up: Return in about 3 months (around 03/12/2024) for MDD and GAD.    Suzzane MARLA Blanch, MD

## 2023-12-11 NOTE — Assessment & Plan Note (Signed)
 Did not tolerate SSRIs and SNRIs in the past Takes Xanax  0.5 mg nightly as needed for anxiety and/or insomnia, refilled - PDMP reviewed Due to recent worsening of MDD, started Zoloft 25 mg QD

## 2023-12-13 NOTE — Assessment & Plan Note (Signed)
 Uncontrolled Has tried SSRI and SNRI in the past Trial of Zoloft 25 mg QD, can adjust dose according to response

## 2023-12-13 NOTE — Assessment & Plan Note (Signed)
 Well-controlled with omeprazole 40 mg QD as needed

## 2023-12-13 NOTE — Assessment & Plan Note (Signed)
 Well-controlled now On Trelegy as maintenance inhaler Refilled albuterol inhaler as needed for dyspnea or wheezing Needs to cut down -> quit smoking

## 2023-12-13 NOTE — Assessment & Plan Note (Signed)
 Skin lesion on RLE appears to be seborrheic keratosis Referred to dermatology-Dr. Shona, due to history of skin cancer

## 2023-12-13 NOTE — Assessment & Plan Note (Addendum)
 Has chronic low back pain and LE weakness Has gait disturbance due to neuropathic symptoms Followed by [facility]-on oxycodone 10 mg Q6h as needed

## 2023-12-13 NOTE — Assessment & Plan Note (Signed)
 Started empiric Augmentin Advised to perform cool compresses over facial swelling area Needs to contact Dentist

## 2023-12-15 LAB — TOXASSURE SELECT 13 (MW), URINE

## 2023-12-23 ENCOUNTER — Telehealth: Payer: Self-pay | Admitting: Pharmacy Technician

## 2023-12-23 ENCOUNTER — Other Ambulatory Visit (HOSPITAL_COMMUNITY): Payer: Self-pay

## 2023-12-23 NOTE — Telephone Encounter (Signed)
 Pharmacy Patient Advocate Encounter   Received notification from CoverMyMeds that prior authorization for cycloSPORINE  0.05% emulsion is required/requested.   Insurance verification completed.   The patient is insured through [employer].   Per test claim:  Brand name Restasis  is preferred by the insurance.  If suggested medication is appropriate, Please send in a new RX and discontinue this one. If not, please advise as to why it's not appropriate so that we may request a Prior Authorization. Please note, some preferred medications may still require a PA.  If the suggested medications have not been trialed and there are no contraindications to their use, the PA will not be submitted, as it will not be approved.  New prescription is not required. Medications can be changed at the pharmacy as brand name preferred. Spoke to [facility] in [place] and they advised the would correct and bill for brand name. $0.00 copay.

## 2023-12-29 ENCOUNTER — Other Ambulatory Visit: Payer: Self-pay | Admitting: Internal Medicine

## 2023-12-29 DIAGNOSIS — J449 Chronic obstructive pulmonary disease, unspecified: Secondary | ICD-10-CM

## 2024-01-07 ENCOUNTER — Other Ambulatory Visit: Payer: Self-pay | Admitting: Internal Medicine

## 2024-01-07 DIAGNOSIS — M5416 Radiculopathy, lumbar region: Secondary | ICD-10-CM

## 2024-01-15 ENCOUNTER — Encounter: Payer: Self-pay | Admitting: *Deleted

## 2024-01-15 ENCOUNTER — Encounter (INDEPENDENT_AMBULATORY_CARE_PROVIDER_SITE_OTHER): Payer: Self-pay | Admitting: Gastroenterology

## 2024-01-15 NOTE — Progress Notes (Signed)
 Audrey Kline                                          MRN: [id number]   01/15/2024   The [facility] Quality Team Specialist reviewed this patient medical record for the purposes of chart review for care gap closure. The following were reviewed: chart review for care gap closure-breast cancer screening.    [facility] Quality Team

## 2024-01-26 ENCOUNTER — Other Ambulatory Visit: Payer: Self-pay | Admitting: Internal Medicine

## 2024-01-26 DIAGNOSIS — J449 Chronic obstructive pulmonary disease, unspecified: Secondary | ICD-10-CM

## 2024-02-14 ENCOUNTER — Other Ambulatory Visit: Payer: Self-pay | Admitting: Internal Medicine

## 2024-02-14 DIAGNOSIS — M5416 Radiculopathy, lumbar region: Secondary | ICD-10-CM

## 2024-02-14 MED ORDER — LIDOCAINE 5 % EX PTCH: MEDICATED_PATCH | CUTANEOUS | 2 refills | Status: AC

## 2024-03-09 ENCOUNTER — Other Ambulatory Visit: Payer: Self-pay | Admitting: Internal Medicine

## 2024-03-09 DIAGNOSIS — F411 Generalized anxiety disorder: Secondary | ICD-10-CM

## 2024-04-01 ENCOUNTER — Ambulatory Visit: Admitting: Internal Medicine

## 2024-04-02 NOTE — Progress Notes (Signed)
 Audrey Kline                                          MRN: [id number]   04/02/2024   The [facility] Quality Team Specialist reviewed this patient medical record for the purposes of chart review for care gap closure. The following were reviewed: chart review for care gap closure-breast cancer screening.    [facility] Quality Team

## 2024-04-12 ENCOUNTER — Other Ambulatory Visit: Payer: Self-pay | Admitting: Internal Medicine

## 2024-04-12 DIAGNOSIS — H04123 Dry eye syndrome of bilateral lacrimal glands: Secondary | ICD-10-CM

## 2024-04-12 DIAGNOSIS — F331 Major depressive disorder, recurrent, moderate: Secondary | ICD-10-CM

## 2024-05-12 ENCOUNTER — Ambulatory Visit: Admitting: Internal Medicine

## 2024-05-26 ENCOUNTER — Ambulatory Visit
